# Patient Record
Sex: Female | Born: 1945 | Race: White | Hispanic: No
Health system: Western US, Academic
[De-identification: ages and names within clinical notes are randomized; demographics above are authoritative.]

## PROBLEM LIST (undated history)

## (undated) DIAGNOSIS — G4700 Insomnia, unspecified: Secondary | ICD-10-CM

## (undated) HISTORY — PX: BREAST EXCISIONAL BIOPSY: SUR124

## (undated) HISTORY — PX: BREAST BIOPSY: SHX20

---

## 2002-06-23 ENCOUNTER — Encounter: Payer: Self-pay | Admitting: Internal Medicine

## 2002-06-23 ENCOUNTER — Ambulatory Visit (HOSPITAL_COMMUNITY): Admission: RE | Admit: 2002-06-23 | Discharge: 2002-06-23 | Payer: Self-pay | Admitting: Internal Medicine

## 2003-07-23 ENCOUNTER — Ambulatory Visit (HOSPITAL_COMMUNITY): Admission: RE | Admit: 2003-07-23 | Discharge: 2003-07-23 | Payer: Self-pay | Admitting: Internal Medicine

## 2004-10-13 ENCOUNTER — Ambulatory Visit (HOSPITAL_COMMUNITY): Admission: RE | Admit: 2004-10-13 | Discharge: 2004-10-13 | Payer: Self-pay | Admitting: Internal Medicine

## 2005-09-13 ENCOUNTER — Other Ambulatory Visit: Admission: RE | Admit: 2005-09-13 | Discharge: 2005-09-13 | Payer: Self-pay | Admitting: Internal Medicine

## 2005-10-24 ENCOUNTER — Ambulatory Visit (HOSPITAL_COMMUNITY): Admission: RE | Admit: 2005-10-24 | Discharge: 2005-10-24 | Payer: Self-pay | Admitting: Internal Medicine

## 2005-11-01 ENCOUNTER — Encounter: Admission: RE | Admit: 2005-11-01 | Discharge: 2005-11-01 | Payer: Self-pay | Admitting: Internal Medicine

## 2006-11-18 ENCOUNTER — Ambulatory Visit (HOSPITAL_COMMUNITY): Admission: RE | Admit: 2006-11-18 | Discharge: 2006-11-18 | Payer: Self-pay | Admitting: Internal Medicine

## 2007-12-05 ENCOUNTER — Ambulatory Visit (HOSPITAL_COMMUNITY): Admission: RE | Admit: 2007-12-05 | Discharge: 2007-12-05 | Payer: Self-pay | Admitting: Internal Medicine

## 2007-12-10 ENCOUNTER — Encounter: Admission: RE | Admit: 2007-12-10 | Discharge: 2007-12-10 | Payer: Self-pay | Admitting: Internal Medicine

## 2008-12-22 ENCOUNTER — Ambulatory Visit (HOSPITAL_COMMUNITY): Admission: RE | Admit: 2008-12-22 | Discharge: 2008-12-22 | Payer: Self-pay | Admitting: Internal Medicine

## 2009-01-03 ENCOUNTER — Encounter: Admission: RE | Admit: 2009-01-03 | Discharge: 2009-01-03 | Payer: Self-pay | Admitting: Internal Medicine

## 2010-03-15 ENCOUNTER — Ambulatory Visit (HOSPITAL_COMMUNITY)
Admission: RE | Admit: 2010-03-15 | Discharge: 2010-03-15 | Payer: Self-pay | Source: Home / Self Care | Attending: Internal Medicine | Admitting: Internal Medicine

## 2010-04-02 ENCOUNTER — Encounter: Payer: Self-pay | Admitting: Internal Medicine

## 2010-04-03 ENCOUNTER — Encounter: Payer: Self-pay | Admitting: Internal Medicine

## 2011-04-11 ENCOUNTER — Other Ambulatory Visit (HOSPITAL_COMMUNITY): Payer: Self-pay | Admitting: Internal Medicine

## 2011-04-11 DIAGNOSIS — Z1231 Encounter for screening mammogram for malignant neoplasm of breast: Secondary | ICD-10-CM

## 2011-05-09 ENCOUNTER — Ambulatory Visit (HOSPITAL_COMMUNITY)
Admission: RE | Admit: 2011-05-09 | Discharge: 2011-05-09 | Disposition: A | Discharge status: Home / Self Care | Payer: BC Managed Care – PPO | Source: Ambulatory Visit | Attending: Internal Medicine | Admitting: Internal Medicine

## 2011-05-09 DIAGNOSIS — Z1231 Encounter for screening mammogram for malignant neoplasm of breast: Secondary | ICD-10-CM

## 2012-06-11 ENCOUNTER — Other Ambulatory Visit (HOSPITAL_COMMUNITY): Payer: Self-pay | Admitting: Internal Medicine

## 2012-06-11 DIAGNOSIS — Z1231 Encounter for screening mammogram for malignant neoplasm of breast: Secondary | ICD-10-CM

## 2012-06-18 ENCOUNTER — Ambulatory Visit (HOSPITAL_COMMUNITY)
Admission: RE | Admit: 2012-06-18 | Discharge: 2012-06-18 | Disposition: A | Discharge status: Home / Self Care | Payer: Medicare Other | Source: Ambulatory Visit | Attending: Internal Medicine | Admitting: Internal Medicine

## 2012-06-18 DIAGNOSIS — Z1231 Encounter for screening mammogram for malignant neoplasm of breast: Secondary | ICD-10-CM | POA: Insufficient documentation

## 2012-12-26 ENCOUNTER — Ambulatory Visit (INDEPENDENT_AMBULATORY_CARE_PROVIDER_SITE_OTHER): Payer: 59 | Admitting: Radiology

## 2012-12-26 DIAGNOSIS — Z23 Encounter for immunization: Secondary | ICD-10-CM

## 2013-04-16 ENCOUNTER — Other Ambulatory Visit: Payer: Self-pay | Admitting: Gastroenterology

## 2013-04-20 ENCOUNTER — Encounter (HOSPITAL_COMMUNITY): Payer: Self-pay | Admitting: Pharmacy Technician

## 2013-04-23 ENCOUNTER — Encounter (HOSPITAL_COMMUNITY): Payer: Self-pay | Admitting: *Deleted

## 2013-05-12 ENCOUNTER — Encounter (HOSPITAL_COMMUNITY)
Admission: RE | Disposition: A | Discharge status: Home / Self Care | Payer: Self-pay | Source: Ambulatory Visit | Attending: Gastroenterology

## 2013-05-12 ENCOUNTER — Ambulatory Visit (HOSPITAL_COMMUNITY)
Admission: RE | Admit: 2013-05-12 | Discharge: 2013-05-12 | Disposition: A | Discharge status: Home / Self Care | Payer: Medicare Other | Source: Ambulatory Visit | Attending: Gastroenterology | Admitting: Gastroenterology

## 2013-05-12 ENCOUNTER — Ambulatory Visit (HOSPITAL_COMMUNITY): Payer: Medicare Other | Admitting: *Deleted

## 2013-05-12 ENCOUNTER — Encounter (HOSPITAL_COMMUNITY): Payer: Self-pay | Admitting: *Deleted

## 2013-05-12 ENCOUNTER — Encounter (HOSPITAL_COMMUNITY): Payer: Medicare Other | Admitting: *Deleted

## 2013-05-12 DIAGNOSIS — Z87440 Personal history of urinary (tract) infections: Secondary | ICD-10-CM | POA: Insufficient documentation

## 2013-05-12 DIAGNOSIS — Z1211 Encounter for screening for malignant neoplasm of colon: Secondary | ICD-10-CM | POA: Insufficient documentation

## 2013-05-12 HISTORY — PX: COLONOSCOPY WITH PROPOFOL: SHX5780

## 2013-05-12 HISTORY — DX: Insomnia, unspecified: G47.00

## 2013-05-12 SURGERY — COLONOSCOPY WITH PROPOFOL
Anesthesia: Monitor Anesthesia Care

## 2013-05-12 MED ORDER — MIDAZOLAM HCL 5 MG/5ML IJ SOLN
INTRAMUSCULAR | Status: DC | PRN
  Administered 2013-05-12: 2 mg via INTRAVENOUS

## 2013-05-12 MED ORDER — PROPOFOL 10 MG/ML IV BOLUS
INTRAVENOUS | Status: DC | PRN
  Administered 2013-05-12: 50 mg via INTRAVENOUS

## 2013-05-12 MED ORDER — MIDAZOLAM HCL 2 MG/2ML IJ SOLN
INTRAMUSCULAR | Status: AC
  Filled 2013-05-12: qty 2

## 2013-05-12 MED ORDER — PROPOFOL 10 MG/ML IV BOLUS
INTRAVENOUS | Status: AC
  Filled 2013-05-12: qty 40

## 2013-05-12 MED ORDER — KETAMINE HCL 10 MG/ML IJ SOLN
INTRAMUSCULAR | Status: AC
  Filled 2013-05-12: qty 1

## 2013-05-12 MED ORDER — KETAMINE HCL 10 MG/ML IJ SOLN
INTRAMUSCULAR | Status: DC | PRN
  Administered 2013-05-12: 20 mg via INTRAVENOUS
  Administered 2013-05-12: 10 mg via INTRAVENOUS

## 2013-05-12 MED ORDER — GLYCOPYRROLATE 0.2 MG/ML IJ SOLN
INTRAMUSCULAR | Status: DC | PRN
  Administered 2013-05-12 (×2): .02 mg via INTRAVENOUS

## 2013-05-12 MED ORDER — SODIUM CHLORIDE 0.9 % IV SOLN: INTRAVENOUS | Status: DC

## 2013-05-12 MED ORDER — GLYCOPYRROLATE 0.2 MG/ML IJ SOLN
INTRAMUSCULAR | Status: AC
  Filled 2013-05-12: qty 1

## 2013-05-12 MED ORDER — LACTATED RINGERS IV SOLN
INTRAVENOUS | Status: DC
  Administered 2013-05-12: 08:00:00 via INTRAVENOUS

## 2013-05-12 MED ORDER — PROPOFOL INFUSION 10 MG/ML OPTIME
INTRAVENOUS | Status: DC | PRN
  Administered 2013-05-12: 200 ug/kg/min via INTRAVENOUS

## 2013-05-12 SURGICAL SUPPLY — 22 items

## 2013-05-12 NOTE — Anesthesia Preprocedure Evaluation (Signed)
Anesthesia Evaluation  Patient identified by MRN, date of birth, ID band Patient awake    Reviewed: Allergy & Precautions, H&P , NPO status , Patient's Chart, lab work & pertinent test results  Airway Mallampati: II TM Distance: >3 FB Neck ROM: Full    Dental no notable dental hx.    Pulmonary neg pulmonary ROS,  breath sounds clear to auscultation  Pulmonary exam normal       Cardiovascular negative cardio ROS  Rhythm:Regular Rate:Normal     Neuro/Psych negative neurological ROS  negative psych ROS   GI/Hepatic negative GI ROS, Neg liver ROS,   Endo/Other  negative endocrine ROS  Renal/GU negative Renal ROS  negative genitourinary   Musculoskeletal negative musculoskeletal ROS (+)   Abdominal   Peds negative pediatric ROS (+)  Hematology negative hematology ROS (+)   Anesthesia Other Findings   Reproductive/Obstetrics negative OB ROS                           Anesthesia Physical Anesthesia Plan  ASA: II  Anesthesia Plan: MAC   Post-op Pain Management:    Induction: Intravenous  Airway Management Planned: Simple Face Mask  Additional Equipment:   Intra-op Plan:   Post-operative Plan:   Informed Consent: I have reviewed the patients History and Physical, chart, labs and discussed the procedure including the risks, benefits and alternatives for the proposed anesthesia with the patient or authorized representative who has indicated his/her understanding and acceptance.   Dental advisory given  Plan Discussed with: CRNA  Anesthesia Plan Comments:         Anesthesia Quick Evaluation

## 2013-05-12 NOTE — Preoperative (Signed)
Beta Blockers   Reason not to administer Beta Blockers:Not Applicable, not on home BB 

## 2013-05-12 NOTE — Transfer of Care (Signed)
Immediate Anesthesia Transfer of Care Note  Patient: Ellen Gray  Procedure(s) Performed: Procedure(s): COLONOSCOPY WITH PROPOFOL (N/A)  Patient Location: PACU, endo  Anesthesia Type:MAC  Level of Consciousness: Patient easily awoken, sedated, comfortable, cooperative, following commands, responds to stimulation.   Airway & Oxygen Therapy: Patient spontaneously breathing, ventilating well, oxygen via simple oxygen mask.  Post-op Assessment: Report given to PACU RN, vital signs reviewed and stable, moving all extremities.   Post vital signs: Reviewed and stable.  Complications: No apparent anesthesia complications

## 2013-05-12 NOTE — Anesthesia Postprocedure Evaluation (Signed)
  Anesthesia Post-op Note  Patient: Ellen Gray  Procedure(s) Performed: Procedure(s) (LRB): COLONOSCOPY WITH PROPOFOL (N/A)  Patient Location: PACU  Anesthesia Type: MAC  Level of Consciousness: awake and alert   Airway and Oxygen Therapy: Patient Spontanous Breathing  Post-op Pain: mild  Post-op Assessment: Post-op Vital signs reviewed, Patient's Cardiovascular Status Stable, Respiratory Function Stable, Patent Airway and No signs of Nausea or vomiting  Last Vitals:  Filed Vitals:   05/12/13 0900  BP: 116/74  Pulse:   Temp:   Resp: 16    Post-op Vital Signs: stable   Complications: No apparent anesthesia complications

## 2013-05-12 NOTE — Op Note (Signed)
Procedure: Baseline screening colonoscopy  Endoscopist: Danise EdgeMartin Rodderick Holtzer  Premedication: Propofol administered by anesthesia  Procedure: The patient was placed in the left lateral decubitus position. Anal inspection and digital rectal exam were normal. The Pentax pediatric colonoscope was introduced into the rectum and advanced to the cecum. A normal-appearing appendiceal orifice and ileocecal valve were identified. Colonic preparation for the exam today was good.  Rectum. Normal. Retroflexed view of the distal rectum normal  Sigmoid colon and descending colon. Normal  Splenic flexure. Normal  Transverse colon. Normal  Hepatic flexure. Normal  Ascending colon. Normal  Cecum and ileocecal valve. Normal  Assessment: Normal screening proctocolonoscopy to the cecum  Recommendation: Schedule repeat screening colonoscopy in 10 years

## 2013-05-12 NOTE — H&P (Signed)
  Procedure: Baseline screening colonoscopy  History: The patient is a 68 year old female born 21-Dec-1945. She is scheduled to undergo her first screening colonoscopy with polypectomy to prevent colon cancer.  Past medical history: Urinary tract infection. Breast biopsy.  Habits: The patient is never smoked cigarettes  Exam: The patient is alert and lying comfortably on the endoscopy stretcher. Lungs are clear to auscultation. Cardiac exam reveals a regular rhythm. Abdomen is soft and nontender to palpation.  Plan: Proceed with baseline screening colonoscopy

## 2013-05-13 ENCOUNTER — Encounter (HOSPITAL_COMMUNITY): Payer: Self-pay | Admitting: Gastroenterology

## 2014-06-02 ENCOUNTER — Other Ambulatory Visit (HOSPITAL_COMMUNITY): Payer: Self-pay | Admitting: Internal Medicine

## 2014-06-02 DIAGNOSIS — Z1231 Encounter for screening mammogram for malignant neoplasm of breast: Secondary | ICD-10-CM

## 2014-06-22 ENCOUNTER — Other Ambulatory Visit (HOSPITAL_COMMUNITY): Payer: Self-pay | Admitting: Internal Medicine

## 2014-06-22 ENCOUNTER — Ambulatory Visit (HOSPITAL_COMMUNITY)
Admission: RE | Admit: 2014-06-22 | Discharge: 2014-06-22 | Disposition: A | Discharge status: Home / Self Care | Payer: Medicare Other | Source: Ambulatory Visit | Attending: Internal Medicine | Admitting: Internal Medicine

## 2014-06-22 DIAGNOSIS — Z1231 Encounter for screening mammogram for malignant neoplasm of breast: Secondary | ICD-10-CM | POA: Insufficient documentation

## 2015-06-03 ENCOUNTER — Other Ambulatory Visit: Payer: Self-pay

## 2015-06-03 DIAGNOSIS — Z1231 Encounter for screening mammogram for malignant neoplasm of breast: Secondary | ICD-10-CM

## 2015-06-29 ENCOUNTER — Ambulatory Visit
Admission: RE | Admit: 2015-06-29 | Discharge: 2015-06-29 | Disposition: A | Discharge status: Home / Self Care | Payer: Medicare Other | Source: Ambulatory Visit

## 2015-06-29 DIAGNOSIS — Z1231 Encounter for screening mammogram for malignant neoplasm of breast: Secondary | ICD-10-CM

## 2016-05-22 ENCOUNTER — Other Ambulatory Visit: Payer: Self-pay | Admitting: Internal Medicine

## 2016-05-22 DIAGNOSIS — Z1231 Encounter for screening mammogram for malignant neoplasm of breast: Secondary | ICD-10-CM

## 2016-06-29 ENCOUNTER — Ambulatory Visit
Admission: RE | Admit: 2016-06-29 | Discharge: 2016-06-29 | Disposition: A | Discharge status: Home / Self Care | Payer: Medicare Other | Source: Ambulatory Visit | Attending: Internal Medicine | Admitting: Internal Medicine

## 2016-06-29 DIAGNOSIS — Z1231 Encounter for screening mammogram for malignant neoplasm of breast: Secondary | ICD-10-CM

## 2016-07-06 ENCOUNTER — Other Ambulatory Visit (HOSPITAL_COMMUNITY)
Admission: RE | Admit: 2016-07-06 | Discharge: 2016-07-06 | Disposition: A | Discharge status: Home / Self Care | Payer: Medicare Other | Source: Ambulatory Visit | Attending: Internal Medicine | Admitting: Internal Medicine

## 2016-07-06 ENCOUNTER — Other Ambulatory Visit: Payer: Self-pay | Admitting: Internal Medicine

## 2016-07-06 DIAGNOSIS — Z1151 Encounter for screening for human papillomavirus (HPV): Secondary | ICD-10-CM | POA: Insufficient documentation

## 2016-07-06 DIAGNOSIS — Z01419 Encounter for gynecological examination (general) (routine) without abnormal findings: Secondary | ICD-10-CM | POA: Diagnosis present

## 2016-07-11 LAB — CYTOLOGY - PAP
Diagnosis: NEGATIVE
HPV: NOT DETECTED

## 2017-04-19 ENCOUNTER — Other Ambulatory Visit: Payer: Self-pay

## 2017-04-19 ENCOUNTER — Ambulatory Visit: Payer: Medicare Other | Admitting: Physician Assistant

## 2017-04-19 ENCOUNTER — Encounter: Payer: Self-pay | Admitting: Physician Assistant

## 2017-04-19 VITALS — BP 120/70 | HR 72 | Temp 98.0°F | Ht 64.0 in | Wt 118.2 lb

## 2017-04-19 DIAGNOSIS — R21 Rash and other nonspecific skin eruption: Secondary | ICD-10-CM | POA: Diagnosis not present

## 2017-04-19 MED ORDER — TRIAMCINOLONE ACETONIDE 0.1 % EX CREA: 1.0000 "application " | TOPICAL_CREAM | Freq: Two times a day (BID) | CUTANEOUS | 0 refills | Status: DC

## 2017-04-19 NOTE — Patient Instructions (Addendum)
I am treating you today for eczema (see below).  Apply cream to affected area 1-2 times daily.  To increase healing, wrap the area with plastic wrap after application. (maybe easier to do this at night) Call and leave me a message if you are not improving in 2-3 weeks. I will call in prednisone to your pharmacy.   Come back if your rash worsens or fails to improve.    Eczema Eczema is a broad term for a group of skin conditions that cause skin to become rough and inflamed. Each type of eczema has different triggers, symptoms, and treatments. Eczema of any type is usually itchy and symptoms range from mild to severe. Eczema and its symptoms are not spread from person to person (are not contagious). It can appear on different parts of the body at different times. Your eczema may not look the same as someone else's eczema. What are the types of eczema? Atopic dermatitis This is a long-term (chronic) skin disease that keeps coming back (recurring). Usual symptoms are dry skin and small, solid pimples that may swell and leak fluid (weep). Contact dermatitis This happens when something irritates the skin and causes a rash. The irritation can come from substances that you are allergic to (allergens), such as poison ivy, chemicals, or medicines that were applied to your skin. Dyshidrotic eczema This is a form of eczema on the hands and feet. It shows up as very itchy, fluid-filled blisters. It can affect people of any age, but is more common before age 71. Hand eczema This causes very itchy areas of skin on the palms and sides of the hands and fingers. This type of eczema is common in industrial jobs where you may be exposed to many different types of irritants. Lichen simplex chronicus This type of eczema occurs when a person constantly scratches one area of the body. Repeated scratching of the area leads to thickened skin (lichenification). Lichen simplex chronicus can occur along with other types of  eczema. It is more common in adults, but may be seen in children as well. Nummular eczema This is a common type of eczema. It has no known cause. It typically causes a red, circular, crusty lesion (plaque) that may be itchy. Scratching may become a habit and can cause bleeding. Nummular eczema occurs most often in people of middle-age or older. It most often affects the hands. Seborrheic dermatitis This is a common skin disease that mainly affects the scalp. It may also affect any oily areas of the body, such as the face, sides of nose, eyebrows, ears, eyelids, and chest. It is marked by small scaling and redness of the skin (erythema). This can affect people of all ages. In infants, this condition is known as Location manager"cradle cap." Stasis dermatitis This is a common skin disease that usually appears on the legs and feet. It most often occurs in people who have a condition that prevents blood from being pumped through the veins in the legs (chronic venous insufficiency). Stasis dermatitis is a chronic condition that needs long-term management. How is eczema diagnosed? Your health care provider will examine your skin and review your medical history. He or she may also give you skin patch tests. These tests involve taking patches that contain possible allergens and placing them on your back. He or she will then check in a few days to see if an allergic reaction occurred. What are the common treatments? Treatment for eczema is based on the type of eczema you have.  Hydrocortisone steroid medicine can relieve itching quickly and help reduce inflammation. This medicine may be prescribed or obtained over-the-counter, depending on the strength of the medicine that is needed. Follow these instructions at home:  Take over-the-counter and prescription medicines only as told by your health care provider.  Use creams or ointments to moisturize your skin. Do not use lotions.  Learn what triggers or irritates your  symptoms. Avoid these things.  Treat symptom flare-ups quickly.  Do not itch your skin. This can make your rash worse.  Keep all follow-up visits as told by your health care provider. This is important. Where to find more information:  The American Academy of Dermatology: InfoExam.si  The National Eczema Association: www.nationaleczema.org Contact a health care provider if:  You have serious itching, even with treatment.  You regularly scratch your skin until it bleeds.  Your rash looks different than usual.  Your skin is painful, swollen, or more red than usual.  You have a fever. Summary  There are eight general types of eczema. Each type has different triggers.  Eczema of any type causes itching that may range from mild to severe.  Treatment varies based on the type of eczema you have. Hydrocortisone steroid medicine can help with itching and inflammation.  Protecting your skin is the best way to prevent eczema. Use moisturizers and lotions. Avoid triggers and irritants, and treat flare-ups quickly. This information is not intended to replace advice given to you by your health care provider. Make sure you discuss any questions you have with your health care provider. Document Released: 07/12/2016 Document Revised: 07/12/2016 Document Reviewed: 07/12/2016 Elsevier Interactive Patient Education  2018 [employer].

## 2017-04-19 NOTE — Progress Notes (Signed)
   Ellen Gray  MRN: [id number] DOB: [birth year]  PCP: Deretha Emorysborne, James C, MD  Subjective:  Pt is a 72 year old female who presents to clinic for rash on legs x three weeks. Rash is on the front of both legs. Started as smaller area above ankles and has progressed over the past few weeks. Rash is on legs only. Denies itching, drainage, pain, vesicles. She has tried cortisone cream - not helping. ROS below.   Review of Systems  Constitutional: Negative for chills, diaphoresis, fatigue and fever.  Respiratory: Negative for cough.   Cardiovascular: Negative for chest pain and palpitations.  Gastrointestinal: Negative for abdominal pain, nausea and vomiting.  Musculoskeletal: Negative for arthralgias.  Skin: Positive for rash.    There are no active problems to display for this patient.   Current Outpatient Medications on File Prior to Visit  Medication Sig Dispense Refill  . Calcium Carbonate-Vit D-Min (CALCIUM 1200 PO) Take by mouth.    . diphenhydramine-acetaminophen (TYLENOL PM) 25-500 MG TABS Take 1 tablet by mouth at bedtime.    . Multiple Vitamin (MULTIVITAMIN WITH MINERALS) TABS tablet Take 1 tablet by mouth daily.    . Vitamin D, Ergocalciferol, (DRISDOL) 50000 UNITS CAPS capsule Take 50,000 Units by mouth every 7 (seven) days.     No current facility-administered medications on file prior to visit.     No Known Allergies   Objective:  BP 120/70   Pulse 72   Temp 98 F (36.7 C) (Oral)   Ht 5\' 4"  (1.626 m)   Wt 118 lb 3.2 oz (53.6 kg)   SpO2 98%   BMI 20.29 kg/m   Physical Exam  Constitutional: She is oriented to person, place, and time and well-developed, well-nourished, and in no distress. No distress.  Cardiovascular: Normal rate, regular rhythm and normal heart sounds.  Neurological: She is alert and oriented to person, place, and time. GCS score is 15.  Skin: Skin is warm and dry. Rash noted.  Psychiatric: Mood, memory, affect and judgment normal.  Vitals  reviewed.       Assessment and Plan :  1. Rash and nonspecific skin eruption - triamcinolone cream (KENALOG) 0.1 %; Apply 1 application topically 2 (two) times daily.  Dispense: 30 g; Refill: 0 - Pt c/o rash b/l lower anterior legs. Suspect possibly nummular eczema. Plan to treat with topical kenalog.  OK to call if no improvement in 2-3 weeks. I will call in prednisone to pharmacy.  Marco CollieWhitney Lillith Mcneff, PA-C  [facility] 04/19/2017 5:20 PM

## 2017-05-20 ENCOUNTER — Other Ambulatory Visit: Payer: Self-pay | Admitting: Internal Medicine

## 2017-05-20 DIAGNOSIS — Z1231 Encounter for screening mammogram for malignant neoplasm of breast: Secondary | ICD-10-CM

## 2017-07-25 ENCOUNTER — Ambulatory Visit: Payer: Medicare Other

## 2017-07-25 ENCOUNTER — Ambulatory Visit
Admission: RE | Admit: 2017-07-25 | Discharge: 2017-07-25 | Disposition: A | Discharge status: Home / Self Care | Payer: Medicare Other | Source: Ambulatory Visit | Attending: Internal Medicine | Admitting: Internal Medicine

## 2017-07-25 DIAGNOSIS — Z1231 Encounter for screening mammogram for malignant neoplasm of breast: Secondary | ICD-10-CM

## 2018-01-15 ENCOUNTER — Ambulatory Visit: Payer: Self-pay | Admitting: Physician Assistant

## 2018-01-16 ENCOUNTER — Encounter: Payer: Self-pay | Admitting: Emergency Medicine

## 2018-01-16 ENCOUNTER — Ambulatory Visit: Payer: Medicare Other | Admitting: Emergency Medicine

## 2018-01-16 ENCOUNTER — Other Ambulatory Visit: Payer: Self-pay

## 2018-01-16 VITALS — BP 128/67 | HR 68 | Temp 98.8°F | Resp 16 | Ht 64.0 in | Wt 112.8 lb

## 2018-01-16 DIAGNOSIS — L299 Pruritus, unspecified: Secondary | ICD-10-CM

## 2018-01-16 DIAGNOSIS — R21 Rash and other nonspecific skin eruption: Secondary | ICD-10-CM | POA: Diagnosis not present

## 2018-01-16 DIAGNOSIS — T7840XA Allergy, unspecified, initial encounter: Secondary | ICD-10-CM | POA: Diagnosis not present

## 2018-01-16 MED ORDER — PREDNISONE 20 MG PO TABS: 40.0000 mg | ORAL_TABLET | Freq: Every day | ORAL | 0 refills | Status: AC

## 2018-01-16 MED ORDER — CETIRIZINE HCL 10 MG PO TABS: 10.0000 mg | ORAL_TABLET | Freq: Every day | ORAL | 11 refills | Status: AC

## 2018-01-16 NOTE — Patient Instructions (Addendum)
     If you have lab work done today you will be contacted with your lab results within the next 2 weeks.  If you have not heard from us then please contact us. The fastest way to get your results is to register for My Chart.   IF you received an x-ray today, you will receive an invoice from [facility]. Please contact  Radiology at [phone] with questions or concerns regarding your invoice.   IF you received labwork today, you will receive an invoice from [facility]. Please contact [facility] at [phone] with questions or concerns regarding your invoice.   Our billing staff will not be able to assist you with questions regarding bills from these companies.  You will be contacted with the lab results as soon as they are available. The fastest way to get your results is to activate your My Chart account. Instructions are located on the last page of this paperwork. If you have not heard from us regarding the results in 2 weeks, please contact this office.     Allergies An allergy is when your body reacts to a substance in a way that is not normal. An allergic reaction can happen after you:  Eat something.  Breathe in something.  Touch something.  You can be allergic to:  Things that are only around during certain seasons, like molds and pollens.  Foods.  Drugs.  Insects.  Animal dander.  What are the signs or symptoms?  Puffiness (swelling). This may happen on the lips, face, tongue, mouth, or throat.  Sneezing.  Coughing.  Breathing loudly (wheezing).  Stuffy nose.  Tingling in the mouth.  A rash.  Itching.  Itchy, red, puffy areas of skin (hives).  Watery eyes.  Throwing up (vomiting).  Watery poop (diarrhea).  Dizziness.  Feeling faint or fainting.  Trouble breathing or swallowing.  A tight feeling in the chest.  A fast heartbeat. How is this diagnosed? Allergies can be diagnosed with:  A medical and family  history.  Skin tests.  Blood tests.  A food diary. A food diary is a record of all the foods, drinks, and symptoms you have each day.  The results of an elimination diet. This diet involves making sure not to eat certain foods and then seeing what happens when you start eating them again.  How is this treated? There is no cure for allergies, but allergic reactions can be treated with medicine. Severe reactions usually need to be treated at a hospital. How is this prevented? The best way to prevent an allergic reaction is to avoid the thing you are allergic to. Allergy shots and medicines can also help prevent reactions in some cases. This information is not intended to replace advice given to you by your health care provider. Make sure you discuss any questions you have with your health care provider. Document Released: 06/23/2012 Document Revised: 10/24/2015 Document Reviewed: 12/08/2013 Elsevier Interactive Patient Education  2018 Elsevier Inc.  

## 2018-01-16 NOTE — Progress Notes (Signed)
Ellen Gray 72 y.o.   Chief Complaint  Patient presents with  . Rash    on body x 3-4 days with itching    HISTORY OF PRESENT ILLNESS: This is a 72 y.o. female complaining of itchy diffuse rash for the last 3 to 4 days.  Unknown allergen.  No other significant symptoms.  Has a history of similar events in the past.  No chronic medical condition.  No history of autoimmune disease.  HPI   Prior to Admission medications   Medication Sig Start Date End Date Taking? Authorizing Provider  Calcium Carbonate-Vit D-Min (CALCIUM 1200 PO) Take by mouth.   Yes [provider]  diphenhydramine-acetaminophen (TYLENOL PM) 25-500 MG TABS Take 1 tablet by mouth at bedtime.   Yes [provider]  Multiple Vitamin (MULTIVITAMIN WITH MINERALS) TABS tablet Take 1 tablet by mouth daily.   Yes [provider]  triamcinolone cream (KENALOG) 0.1 % Apply 1 application topically 2 (two) times daily. Patient not taking: Reported on 01/16/2018 04/19/17   McVey, Madelaine Bhat, PA-C  Vitamin D, Ergocalciferol, (DRISDOL) 50000 UNITS CAPS capsule Take 50,000 Units by mouth every 7 (seven) days.    [provider]    No Known Allergies  There are no active problems to display for this patient.   Past Medical History:  Diagnosis Date  . Insomnia     Past Surgical History:  Procedure Laterality Date  . BREAST BIOPSY  30 yrs ago  . BREAST EXCISIONAL BIOPSY Left    1988  . COLONOSCOPY WITH PROPOFOL N/A 05/12/2013   Procedure: COLONOSCOPY WITH PROPOFOL;  Surgeon: Charolett Bumpers, MD;  Location: [facility];  Service: Endoscopy;  Laterality: N/A;    Social History   Socioeconomic History  . Marital status: Divorced    Spouse name: Not on file  . Number of children: Not on file  . Years of education: Not on file  . Highest education level: Not on file  Occupational History  . Not on file  Social Needs  . Financial resource strain: Not on file  . Food  insecurity:    Worry: Not on file    Inability: Not on file  . Transportation needs:    Medical: Not on file    Non-medical: Not on file  Tobacco Use  . Smoking status: Never Smoker  . Smokeless tobacco: Never Used  Substance and Sexual Activity  . Alcohol use: Yes    Alcohol/week: 1.0 standard drinks    Types: 1 Standard drinks or equivalent per week    Comment: occasional  . Drug use: No  . Sexual activity: Not on file  Lifestyle  . Physical activity:    Days per week: Not on file    Minutes per session: Not on file  . Stress: Not on file  Relationships  . Social connections:    Talks on phone: Not on file    Gets together: Not on file    Attends religious service: Not on file    Active member of club or organization: Not on file    Attends meetings of clubs or organizations: Not on file    Relationship status: Not on file  . Intimate partner violence:    Fear of current or ex partner: Not on file    Emotionally abused: Not on file    Physically abused: Not on file    Forced sexual activity: Not on file  Other Topics Concern  . Not on file  Social History Narrative  . Not on file    Family History  Problem Relation Age of Onset  . Breast cancer Neg Hx      Review of Systems  Constitutional: Negative.  Negative for chills and fever.  HENT: Negative.  Negative for congestion and sore throat.   Eyes: Negative.  Negative for discharge and redness.  Respiratory: Negative.  Negative for cough, shortness of breath and wheezing.   Cardiovascular: Negative.  Negative for chest pain and palpitations.  Gastrointestinal: Negative.  Negative for abdominal pain, diarrhea, nausea and vomiting.  Genitourinary: Negative.  Negative for dysuria and hematuria.  Musculoskeletal: Negative for back pain, joint pain, myalgias and neck pain.  Skin: Positive for itching and rash.  Neurological: Negative.  Negative for dizziness, sensory change, focal weakness and headaches.    Endo/Heme/Allergies: Negative.    Vitals:   01/16/18 0805  BP: 128/67  Pulse: 68  Resp: 16  Temp: 98.8 F (37.1 C)  SpO2: 97%     Physical Exam  Constitutional: She is oriented to person, place, and time. She appears well-developed and well-nourished.  HENT:  Head: Normocephalic and atraumatic.  Right Ear: External ear normal.  Left Ear: External ear normal.  Nose: Nose normal.  Mouth/Throat: Oropharynx is clear and moist.  Eyes: Pupils are equal, round, and reactive to light. Conjunctivae and EOM are normal.  Neck: Normal range of motion. Neck supple.  Cardiovascular: Normal rate, regular rhythm and normal heart sounds.  Pulmonary/Chest: Effort normal and breath sounds normal.  Abdominal: Soft. There is no tenderness.  Musculoskeletal: Normal range of motion.  Lymphadenopathy:    She has no cervical adenopathy.  Neurological: She is alert and oriented to person, place, and time. No sensory deficit. She exhibits normal muscle tone. Coordination normal.  Skin: Capillary refill takes less than 2 seconds. Rash (Faint maculopapular diffuse rash) noted.  Psychiatric: She has a normal mood and affect. Her behavior is normal.  Vitals reviewed.    ASSESSMENT & PLAN: Ellen Gray was seen today for rash.  Diagnoses and all orders for this visit:  Allergic reaction, initial encounter -     predniSONE (DELTASONE) 20 MG tablet; Take 2 tablets (40 mg total) by mouth daily with breakfast for 5 days. -     cetirizine (ZYRTEC) 10 MG tablet; Take 1 tablet (10 mg total) by mouth daily for 7 days.  Pruritus  Rash and nonspecific skin eruption    Patient Instructions       If you have lab work done today you will be contacted with your lab results within the next 2 weeks.  If you have not heard from [place] then please contact us. The fastest way to get your results is to register for My Chart.   IF you received an x-ray today, you will receive an invoice from [facility].  Please contact [facility] at [phone] with questions or concerns regarding your invoice.   IF you received labwork today, you will receive an invoice from [facility]. Please contact [facility] at [phone] with questions or concerns regarding your invoice.   Our billing staff will not be able to assist you with questions regarding bills from these companies.  You will be contacted with the lab results as soon as they are available. The fastest way to get your results is to activate your My Chart account. Instructions are located on the last page of this paperwork. If you have not heard from [place] regarding the results in 2 weeks, please  contact this office.     Allergies An allergy is when your body reacts to a substance in a way that is not normal. An allergic reaction can happen after you:  Eat something.  Breathe in something.  Touch something.  You can be allergic to:  Things that are only around during certain seasons, like molds and pollens.  Foods.  Drugs.  Insects.  Animal dander.  What are the signs or symptoms?  Puffiness (swelling). This may happen on the lips, face, tongue, mouth, or throat.  Sneezing.  Coughing.  Breathing loudly (wheezing).  Stuffy nose.  Tingling in the mouth.  A rash.  Itching.  Itchy, red, puffy areas of skin (hives).  Watery eyes.  Throwing up (vomiting).  Watery poop (diarrhea).  Dizziness.  Feeling faint or fainting.  Trouble breathing or swallowing.  A tight feeling in the chest.  A fast heartbeat. How is this diagnosed? Allergies can be diagnosed with:  A medical and family history.  Skin tests.  Blood tests.  A food diary. A food diary is a record of all the foods, drinks, and symptoms you have each day.  The results of an elimination diet. This diet involves making sure not to eat certain foods and then seeing what happens when you start eating them again.  How is this treated? There is  no cure for allergies, but allergic reactions can be treated with medicine. Severe reactions usually need to be treated at a hospital. How is this prevented? The best way to prevent an allergic reaction is to avoid the thing you are allergic to. Allergy shots and medicines can also help prevent reactions in some cases. This information is not intended to replace advice given to you by your health care provider. Make sure you discuss any questions you have with your health care provider. Document Released: 06/23/2012 Document Revised: 10/24/2015 Document Reviewed: 12/08/2013 Elsevier Interactive Patient Education  2018 [employer].      Edwina Barth, MD [facility]

## 2018-07-30 ENCOUNTER — Other Ambulatory Visit: Payer: Self-pay | Admitting: Internal Medicine

## 2018-07-30 DIAGNOSIS — Z1231 Encounter for screening mammogram for malignant neoplasm of breast: Secondary | ICD-10-CM

## 2018-09-17 ENCOUNTER — Ambulatory Visit
Admission: RE | Admit: 2018-09-17 | Discharge: 2018-09-17 | Disposition: A | Discharge status: Home / Self Care | Payer: Medicare Other | Source: Ambulatory Visit | Attending: Internal Medicine | Admitting: Internal Medicine

## 2018-09-17 ENCOUNTER — Other Ambulatory Visit: Payer: Self-pay

## 2018-09-17 DIAGNOSIS — Z1231 Encounter for screening mammogram for malignant neoplasm of breast: Secondary | ICD-10-CM

## 2019-05-09 ENCOUNTER — Ambulatory Visit: Payer: Medicare PPO | Attending: Internal Medicine

## 2019-05-09 DIAGNOSIS — Z23 Encounter for immunization: Secondary | ICD-10-CM

## 2019-05-09 NOTE — Progress Notes (Signed)
   Covid-19 Vaccination Clinic  Name:  Ellen Gray    MRN: [id number] DOB: 19-Feb-1946  05/09/2019  Ellen Gray was observed post Covid-19 immunization for 15 minutes without incidence. She was provided with Vaccine Information Sheet and instruction to access the V-Safe system.   Ellen Gray was instructed to call 911 with any severe reactions post vaccine: Marland Kitchen Difficulty breathing  . Swelling of your face and throat  . A fast heartbeat  . A bad rash all over your body  . Dizziness and weakness    Immunizations Administered    Name Date Dose VIS Date Route   Pfizer COVID-19 Vaccine 05/09/2019 10:49 AM 0.3 mL 02/20/2019 Intramuscular   Manufacturer: [employer], [employer]   Lot: [id number]   NDC: [address]-0

## 2019-06-03 ENCOUNTER — Ambulatory Visit: Payer: Medicare PPO | Attending: Internal Medicine

## 2019-06-03 DIAGNOSIS — Z23 Encounter for immunization: Secondary | ICD-10-CM

## 2019-06-03 NOTE — Progress Notes (Signed)
   Covid-19 Vaccination Clinic  Name:  Ellen Gray    MRN: [id number] DOB: 1945/12/19  06/03/2019  [name] was observed post Covid-19 immunization for 15 minutes without incident. She was provided with Vaccine Information Sheet and instruction to access the V-Safe system.   [name] was instructed to call 911 with any severe reactions post vaccine: Marland Kitchen Difficulty breathing  . Swelling of face and throat  . A fast heartbeat  . A bad rash all over body  . Dizziness and weakness   Immunizations Administered    Name Date Dose VIS Date Route   Pfizer COVID-19 Vaccine 06/03/2019  3:26 PM 0.3 mL 02/20/2019 Intramuscular   Manufacturer: [employer], [employer]   Lot: [id number]   NDC: [address]-2

## 2019-08-03 ENCOUNTER — Other Ambulatory Visit: Payer: Self-pay | Admitting: Internal Medicine

## 2019-08-03 DIAGNOSIS — M810 Age-related osteoporosis without current pathological fracture: Secondary | ICD-10-CM

## 2019-08-03 DIAGNOSIS — Z1231 Encounter for screening mammogram for malignant neoplasm of breast: Secondary | ICD-10-CM

## 2019-09-30 ENCOUNTER — Ambulatory Visit: Payer: Medicare PPO

## 2019-09-30 ENCOUNTER — Other Ambulatory Visit: Payer: Medicare PPO

## 2019-10-08 ENCOUNTER — Ambulatory Visit
Admission: RE | Admit: 2019-10-08 | Discharge: 2019-10-08 | Disposition: A | Discharge status: Home / Self Care | Payer: Medicare PPO | Source: Ambulatory Visit | Attending: Internal Medicine | Admitting: Internal Medicine

## 2019-10-08 ENCOUNTER — Other Ambulatory Visit: Payer: Self-pay

## 2019-10-08 DIAGNOSIS — M810 Age-related osteoporosis without current pathological fracture: Secondary | ICD-10-CM

## 2019-10-08 DIAGNOSIS — Z1231 Encounter for screening mammogram for malignant neoplasm of breast: Secondary | ICD-10-CM

## 2019-10-13 DIAGNOSIS — D1801 Hemangioma of skin and subcutaneous tissue: Secondary | ICD-10-CM | POA: Diagnosis not present

## 2019-10-13 DIAGNOSIS — L814 Other melanin hyperpigmentation: Secondary | ICD-10-CM | POA: Diagnosis not present

## 2019-10-13 DIAGNOSIS — L570 Actinic keratosis: Secondary | ICD-10-CM | POA: Diagnosis not present

## 2019-10-13 DIAGNOSIS — L821 Other seborrheic keratosis: Secondary | ICD-10-CM | POA: Diagnosis not present

## 2019-10-13 DIAGNOSIS — D225 Melanocytic nevi of trunk: Secondary | ICD-10-CM | POA: Diagnosis not present

## 2019-11-06 ENCOUNTER — Ambulatory Visit
Admission: RE | Admit: 2019-11-06 | Discharge: 2019-11-06 | Disposition: A | Discharge status: Home / Self Care | Payer: Medicare PPO | Source: Ambulatory Visit | Attending: Internal Medicine | Admitting: Internal Medicine

## 2019-11-06 ENCOUNTER — Other Ambulatory Visit: Payer: Self-pay | Admitting: Internal Medicine

## 2019-11-06 DIAGNOSIS — M1711 Unilateral primary osteoarthritis, right knee: Secondary | ICD-10-CM

## 2019-11-06 DIAGNOSIS — M1990 Unspecified osteoarthritis, unspecified site: Secondary | ICD-10-CM | POA: Diagnosis not present

## 2019-11-06 DIAGNOSIS — M810 Age-related osteoporosis without current pathological fracture: Secondary | ICD-10-CM | POA: Diagnosis not present

## 2020-02-08 ENCOUNTER — Ambulatory Visit
Admission: RE | Admit: 2020-02-08 | Discharge: 2020-02-08 | Disposition: A | Discharge status: Home / Self Care | Payer: Medicare PPO | Source: Ambulatory Visit | Attending: Internal Medicine | Admitting: Internal Medicine

## 2020-02-08 ENCOUNTER — Other Ambulatory Visit: Payer: Self-pay | Admitting: Internal Medicine

## 2020-02-08 DIAGNOSIS — M79674 Pain in right toe(s): Secondary | ICD-10-CM

## 2020-02-08 DIAGNOSIS — M19071 Primary osteoarthritis, right ankle and foot: Secondary | ICD-10-CM | POA: Diagnosis not present

## 2020-05-10 DIAGNOSIS — D485 Neoplasm of uncertain behavior of skin: Secondary | ICD-10-CM | POA: Diagnosis not present

## 2020-05-10 DIAGNOSIS — C4442 Squamous cell carcinoma of skin of scalp and neck: Secondary | ICD-10-CM | POA: Diagnosis not present

## 2020-05-10 DIAGNOSIS — L821 Other seborrheic keratosis: Secondary | ICD-10-CM | POA: Diagnosis not present

## 2020-05-10 DIAGNOSIS — L820 Inflamed seborrheic keratosis: Secondary | ICD-10-CM | POA: Diagnosis not present

## 2020-05-19 DIAGNOSIS — C4442 Squamous cell carcinoma of skin of scalp and neck: Secondary | ICD-10-CM | POA: Diagnosis not present

## 2020-07-29 DIAGNOSIS — J0100 Acute maxillary sinusitis, unspecified: Secondary | ICD-10-CM | POA: Diagnosis not present

## 2020-08-09 DIAGNOSIS — Z0000 Encounter for general adult medical examination without abnormal findings: Secondary | ICD-10-CM | POA: Diagnosis not present

## 2020-08-09 DIAGNOSIS — E559 Vitamin D deficiency, unspecified: Secondary | ICD-10-CM | POA: Diagnosis not present

## 2020-08-09 DIAGNOSIS — M810 Age-related osteoporosis without current pathological fracture: Secondary | ICD-10-CM | POA: Diagnosis not present

## 2020-08-09 DIAGNOSIS — M1711 Unilateral primary osteoarthritis, right knee: Secondary | ICD-10-CM | POA: Diagnosis not present

## 2020-08-09 DIAGNOSIS — Z1389 Encounter for screening for other disorder: Secondary | ICD-10-CM | POA: Diagnosis not present

## 2020-08-09 DIAGNOSIS — Z79899 Other long term (current) drug therapy: Secondary | ICD-10-CM | POA: Diagnosis not present

## 2020-08-09 DIAGNOSIS — E785 Hyperlipidemia, unspecified: Secondary | ICD-10-CM | POA: Diagnosis not present

## 2020-08-19 DIAGNOSIS — J309 Allergic rhinitis, unspecified: Secondary | ICD-10-CM | POA: Diagnosis not present

## 2020-09-06 ENCOUNTER — Other Ambulatory Visit: Payer: Self-pay | Admitting: Internal Medicine

## 2020-09-06 DIAGNOSIS — Z1231 Encounter for screening mammogram for malignant neoplasm of breast: Secondary | ICD-10-CM

## 2020-09-20 DIAGNOSIS — L821 Other seborrheic keratosis: Secondary | ICD-10-CM | POA: Diagnosis not present

## 2020-09-20 DIAGNOSIS — Z85828 Personal history of other malignant neoplasm of skin: Secondary | ICD-10-CM | POA: Diagnosis not present

## 2020-09-20 DIAGNOSIS — D225 Melanocytic nevi of trunk: Secondary | ICD-10-CM | POA: Diagnosis not present

## 2020-09-20 DIAGNOSIS — L905 Scar conditions and fibrosis of skin: Secondary | ICD-10-CM | POA: Diagnosis not present

## 2020-09-20 DIAGNOSIS — L814 Other melanin hyperpigmentation: Secondary | ICD-10-CM | POA: Diagnosis not present

## 2020-11-01 ENCOUNTER — Other Ambulatory Visit: Payer: Self-pay

## 2020-11-01 ENCOUNTER — Ambulatory Visit
Admission: RE | Admit: 2020-11-01 | Discharge: 2020-11-01 | Disposition: A | Discharge status: Home / Self Care | Payer: Medicare PPO | Source: Ambulatory Visit | Attending: Internal Medicine | Admitting: Internal Medicine

## 2020-11-01 DIAGNOSIS — Z1231 Encounter for screening mammogram for malignant neoplasm of breast: Secondary | ICD-10-CM | POA: Diagnosis not present

## 2020-12-31 DIAGNOSIS — Z23 Encounter for immunization: Secondary | ICD-10-CM | POA: Diagnosis not present

## 2021-03-22 DIAGNOSIS — R059 Cough, unspecified: Secondary | ICD-10-CM | POA: Diagnosis not present

## 2021-03-22 DIAGNOSIS — J069 Acute upper respiratory infection, unspecified: Secondary | ICD-10-CM | POA: Diagnosis not present

## 2021-03-22 DIAGNOSIS — R519 Headache, unspecified: Secondary | ICD-10-CM | POA: Diagnosis not present

## 2021-07-15 ENCOUNTER — Ambulatory Visit (INDEPENDENT_AMBULATORY_CARE_PROVIDER_SITE_OTHER): Payer: Medicare HMO | Admitting: Family Medicine

## 2021-07-15 VITALS — BP 149/74 | HR 106 | Temp 98.8°F | Resp 18

## 2021-07-15 DIAGNOSIS — J45909 Unspecified asthma, uncomplicated: Secondary | ICD-10-CM

## 2021-07-15 DIAGNOSIS — U071 COVID-19: Secondary | ICD-10-CM

## 2021-07-15 LAB — POC COVID-19 QUAL RAPID PCR, ONSITE (UWNC): COVID-19 Qualitative Rapid PCR Result (UWNC): POSITIVE — AB

## 2021-07-15 MED ORDER — GUAIFENESIN-CODEINE 100-10 MG/5ML OR SYRP
5.0000 mL | ORAL_SOLUTION | ORAL | 0 refills | Status: AC | PRN
Start: 2021-07-15 — End: ?

## 2021-07-15 MED ORDER — METHYLPREDNISOLONE 4 MG OR TABS
ORAL_TABLET | ORAL | 0 refills | Status: AC
Start: 2021-07-15 — End: ?

## 2021-07-15 NOTE — Patient Instructions (Signed)
Your test for COVID-19 was positive.    I have prescribed some prednisone to help with the wheezing.    Codeine cough syrup prescribed    You should isolate until you are feeling better.    Please make sure your husband tells his doctor about your COVID-19 as he may benefit from [place].    Your test for the novel coronavirus, also known as COVID-19, was positive. The sample showed that the virus was present.     Isolation Recommendations:  If your symptoms are generally mild and stable, you are safe to isolate yourself at home. If you develop difficulty breathing, you should contact your doctor.     The most important thing you can do is to remain home except to receive medical care. Do not go to work, school, or public areas.     You should remain isolated:  for a minimum of 5 days from symptom onset and   at least 24 hours have passed since your last fever without the use of fever-reducing medications and   other COVID-19 symptoms have improved.  After which you should wear a high-quality, well-fitting mask around others at all times for 5 additional days.    If you are unable to isolate, please contact your clinic for guidance.   If you have not had any symptoms of COVID-19 (asymptomatic), you should remain isolated for 5 days from the date of your positive test and then should wear a high-quality, well-fitting mask around others at all times for another 5 days. If you go on to develop symptoms, you should isolate according to the guidelines above.    If you are a Dietitian, please communicate with your employer and their employee health program for return-to-work protocols.    Guidance may differ for people with moderate or severe illness, people with compromised immune systems, and people who live or work in congregate settings. For these situations, please refer to information at the [facility] website at [email or web address].org/.html     Treatment:  There  are oral medications available to treat patients with mild-to-moderate COVID-19 and who are at higher risk for progressing to severe COVID-19 according to the CDC.    Your symptoms must have started within the last 5 days and be considered high-risk for developing severe COVID-19.      See [facility] website for additional information. MediaLives.de    If you feel you are eligible, talk to your provider.     Please do not go to the emergency department or pharmacy to request treatment without prior approval. However, if you have life-threatening symptoms, please seek medical care immediately.    Re-Testing:  If you have tested positive for COVID-19, the CDC does not recommend repeat testing within 90 days of the positive test, unless stated otherwise by your healthcare provider. The test can remain positive for many weeks due to fragments of virus that may be detected but are no longer infectious.    For recommendations on testing and quarantine for household contacts, please visit [email or web address]     Household contacts:  Everyone who has been exposed regardless of vaccination status should get a COVID test five days after exposure. If symptoms occur, individuals should immediately quarantine until a test confirms symptoms are not caused due to COVID-19.    To minimize exposure and spread, layer your protection strategies by:  Getting vaccinated or boosted when eligible  Avoiding large gatherings and crowded indoor spaces  Wearing a high-quality and well-fitting mask indoors  in public places  Getting tested prior to gathering with others. Testing the day of the gathering is best.  Optimizing indoor air quality and ventilation.    Please remember to:  Wash your hands often with soap and water for at least 20 seconds. Alternatively, you may use hand sanitizer with at least 60% alcohol content.  You should wear a  mask over your nose and mouth if you must be around other people or animals, including pets (even at home). You don't need to wear the mask if you are alone.   Try to stay at least 6 feet away from other people. This will help protect the people around you.  Clean all "high-touch" surfaces every day such as doorknobs and cellphones using an EPA approved cleaner (more information can be found on the CDC's website).  Continually monitor your symptoms and contact your doctor if you need medical attention.    For COVID testing sites: SocialDemand.no  Please remember to:  Get vaccinated or boosted when eligible: ForumChats.es  Avoid large gatherings and crowded indoor spaces  Wear a high-quality and well-fitting mask in public spaces  Get tested prior to gathering with others. Testing on the day of the gathering is best.  Open windows and doors when gathering in indoor spaces.     Visit the [facility] COVID-19 Page for additional information at [email or web address].[email or web address]/    For general Covid-19 questions please call us at [phone]    Buren Kos, M.D.  Clinical Assistant Professor  Department of [place] [facility]

## 2021-07-15 NOTE — Progress Notes (Signed)
The following encounter was generated from the [facility] [place].    New patient.      ID/CC: 76 yo woman with cough     HPI: she has been ill a little over a week.  Started with scratchy throat and drippy nose and is now in her chest.  Still with somie nasal congestion.  Throat is still irritated mainly because she is breathing through her mouth.  Hard to breathe through her nose.    Thick nasal discharge.    Cough is present day and night.  Occasionally SOB.  Brings thick phlegm up.      Has been hoarse    No fevers or chills.      Had negative home COVID-19 test a couple days ago.    Had COVID-19 in February, 2023.  Got over it in about 5 days, but husband needed long stretch in the ICU and rehab.    Has asthma.  Has been using her asthma medications.    Has tried coricidin, Nasacort, Afrin    No past medical history on file.    There is no problem list on file for this patient.          Outpatient Medications Prior to Visit   Medication Sig Dispense Refill   . Advair Diskus 500-50 MCG/ACT diskus inhaler inhale 1 puff by mouth 2 times daily. rinse mouth after use     . albuterol HFA 108 (90 Base) MCG/ACT inhaler Inhale 2 puffs by mouth.     Marland Kitchen atorvastatin 20 MG tablet Take 1 tablet (20 mg) by mouth.     . biotin 1000 MCG tablet Take by mouth.     . cholecalciferol 50 mcg (2,000 unit) capsule Take by mouth.     . losartan 100 MG tablet Take 1 tablet (100 mg) by mouth daily.     . montelukast 10 MG tablet Take 1 tablet (10 mg) by mouth.       No facility-administered medications prior to visit.     Review of patient's allergies indicates:  Allergies   Allergen Reactions   . Lisinopril Resp: Cough   . Sulfa Antibiotics Skin: Hives         Exam:  BP (!) 149/74   Pulse (!) 106   Temp 37.1 C (Temporal)   Resp 18   SpO2 97%   General appearance: she has a moist cough  Head: normocephalic, atraumatic.    Eyes:  Normal lids.  Conjunctiva without redness or discharge, extraocular muscles intact, pupils  equally round and reactive to light.  Ears: pinnae normal bilaterally, external auditory canals normal, no obstruction, tympanic membranes normal bilaterally without any signs of redness or effusion beyond the TM.  Nose: normal nares, no nasal discharge, normal mucosa.  Mouth: normal lips, gums, pharynx normal without signs of inflammation.  Very hoarse.  Neck: supple, good range of motion, lymph nodes not enlarged or tender, no thyromegaly.    Lungs: central rhonchi and wheezing.  Lots of coughing.  Heart:  RRR, no murmurs    Labs/studies:    Results for orders placed or performed in visit on 07/15/21   POC COVID-19 Qual Rapid PCR, Onsite [facility])   Result Value Ref Range    COVID-19 Qualitative Rapid PCR Result ([facility]) POSITIVE (A)          Assessment:    Encounter Diagnoses   Name Primary?   . 2019 novel coronavirus disease (COVID-19) Yes   . Asthma, unspecified  asthma severity, unspecified whether complicated, unspecified whether persistent      She is outside the window for Paxlovid.      Plan:    1.  Discussed findings and impressions with patient.  2.  I explained that this is viral and that antibiotics won't help at this stage.  3.  Medrol dosepak prescribed to help with the wheezing and SOB  4.  Codeine cough syrup also prescribed.  5.  Rest, fluids.   6.  Isolation per protocol and standard COVID-19 guidelines.  7.  Encouraged her to have her husband get seen via telehealth visit with his primary care provider to discuss getting on Paxlovid.  8.  F/u with primary care provider for recheck.      Franchot Gallo, M.D.  Clinical assistant professor  Department of Family Medicine  [place] [facility]

## 2021-07-16 NOTE — Addendum Note (Signed)
Addended by: Ned Card MINSU on: 07/16/2021 10:27 AM     Modules accepted: Orders

## 2021-07-26 ENCOUNTER — Emergency Department (HOSPITAL_COMMUNITY): Payer: Medicare PPO

## 2021-07-26 ENCOUNTER — Other Ambulatory Visit: Payer: Self-pay

## 2021-07-26 ENCOUNTER — Encounter (HOSPITAL_COMMUNITY): Payer: Self-pay

## 2021-07-26 ENCOUNTER — Emergency Department (HOSPITAL_COMMUNITY)
Admission: EM | Admit: 2021-07-26 | Discharge: 2021-07-26 | Discharge status: Against Medical Advice | Payer: Medicare PPO | Attending: Physician Assistant | Admitting: Physician Assistant

## 2021-07-26 DIAGNOSIS — R0602 Shortness of breath: Secondary | ICD-10-CM | POA: Diagnosis not present

## 2021-07-26 DIAGNOSIS — R0781 Pleurodynia: Secondary | ICD-10-CM | POA: Insufficient documentation

## 2021-07-26 DIAGNOSIS — Z5321 Procedure and treatment not carried out due to patient leaving prior to being seen by health care provider: Secondary | ICD-10-CM | POA: Diagnosis not present

## 2021-07-26 DIAGNOSIS — R0789 Other chest pain: Secondary | ICD-10-CM | POA: Diagnosis not present

## 2021-07-26 LAB — CBC WITH DIFFERENTIAL/PLATELET
Abs Immature Granulocytes: 0.07 10*3/uL (ref 0.00–0.07)
Basophils Absolute: 0.1 10*3/uL (ref 0.0–0.1)
Basophils Relative: 1 %
Eosinophils Absolute: 0.3 10*3/uL (ref 0.0–0.5)
Eosinophils Relative: 2 %
HCT: 41.8 % (ref 36.0–46.0)
Hemoglobin: 13.4 g/dL (ref 12.0–15.0)
Immature Granulocytes: 0 %
Lymphocytes Relative: 8 %
Lymphs Abs: 1.3 10*3/uL (ref 0.7–4.0)
MCH: 29.3 pg (ref 26.0–34.0)
MCHC: 32.1 g/dL (ref 30.0–36.0)
MCV: 91.3 fL (ref 80.0–100.0)
Monocytes Absolute: 1.1 10*3/uL — ABNORMAL HIGH (ref 0.1–1.0)
Monocytes Relative: 7 %
Neutro Abs: 13.1 10*3/uL — ABNORMAL HIGH (ref 1.7–7.7)
Neutrophils Relative %: 82 %
Platelets: 373 10*3/uL (ref 150–400)
RBC: 4.58 MIL/uL (ref 3.87–5.11)
RDW: 12.9 % (ref 11.5–15.5)
WBC: 15.9 10*3/uL — ABNORMAL HIGH (ref 4.0–10.5)
nRBC: 0 % (ref 0.0–0.2)

## 2021-07-26 LAB — D-DIMER, QUANTITATIVE: D-Dimer, Quant: 0.29 ug/mL-FEU (ref 0.00–0.50)

## 2021-07-26 LAB — COMPREHENSIVE METABOLIC PANEL
ALT: 17 U/L (ref 0–44)
AST: 23 U/L (ref 15–41)
Albumin: 4.2 g/dL (ref 3.5–5.0)
Alkaline Phosphatase: 74 U/L (ref 38–126)
Anion gap: 7 (ref 5–15)
BUN: 18 mg/dL (ref 8–23)
CO2: 28 mmol/L (ref 22–32)
Calcium: 9.3 mg/dL (ref 8.9–10.3)
Chloride: 106 mmol/L (ref 98–111)
Creatinine, Ser: 0.72 mg/dL (ref 0.44–1.00)
GFR, Estimated: >60 mL/min (ref >60)
Glucose, Bld: 109 mg/dL — ABNORMAL HIGH (ref 70–99)
Potassium: 3.8 mmol/L (ref 3.5–5.1)
Sodium: 141 mmol/L (ref 135–145)
Total Bilirubin: 0.5 mg/dL (ref 0.3–1.2)
Total Protein: 7.6 g/dL (ref 6.5–8.1)

## 2021-07-26 LAB — TROPONIN I (HIGH SENSITIVITY): Troponin I (High Sensitivity): 2 ng/L (ref <18)

## 2021-07-26 LAB — LIPASE, BLOOD: Lipase: 34 U/L (ref 11–51)

## 2021-07-26 NOTE — ED Provider Triage Note (Signed)
Emergency Medicine Provider Triage Evaluation Note  Ellen Gray , a 76 y.o. female  was evaluated in triage.  Pt complains of right-sided rib, upper abdominal pain.  Began earlier today.  Worse with deep breathing.  Wraps around into her upper back.  No prior abdominal surgeries.  Has never had anything like this previously.  Did recently do some yard work.  No rashes or lesions.  No history of PE or DVT.  No recent surgery, immobilization or malignancy.  Review of Systems  Positive: Right chest pain Negative:   Physical Exam  BP 136/83   Pulse 86   Temp 99.1 F (37.3 C) (Oral)   Resp 19   Ht 5\' 4"  (1.626 m)   Wt 54.4 kg   SpO2 95%   BMI 20.60 kg/m  Gen:   Awake, no distress   Resp:  Normal effort MSK:   Moves extremities without difficulty  Other:    Medical Decision Making  Medically screening exam initiated at 7:32 PM.  Appropriate orders placed.  was informed that the remainder of the evaluation will be completed by another provider, this initial triage assessment does not replace that evaluation, and the importance of remaining in the ED until their evaluation is complete.  Right chest pain   Claira Jeter A, PA-C 07/26/21 1933

## 2021-07-26 NOTE — ED Notes (Signed)
Patient transported to X-ray 

## 2021-07-26 NOTE — ED Triage Notes (Signed)
Pt reports with right rib/chest pain that happened when she tried to lie down this evening.  ?

## 2021-07-26 NOTE — ED Notes (Signed)
Pt request to leave due to wait time. State result are in mychart and will have PCP take a look. IV remove from right [facility] ?

## 2021-07-27 DIAGNOSIS — J189 Pneumonia, unspecified organism: Secondary | ICD-10-CM | POA: Diagnosis not present

## 2021-08-14 ENCOUNTER — Other Ambulatory Visit: Payer: Self-pay | Admitting: Physician Assistant

## 2021-08-14 ENCOUNTER — Ambulatory Visit
Admission: RE | Admit: 2021-08-14 | Discharge: 2021-08-14 | Disposition: A | Discharge status: Home / Self Care | Payer: Medicare PPO | Source: Ambulatory Visit | Attending: Physician Assistant | Admitting: Physician Assistant

## 2021-08-14 DIAGNOSIS — R918 Other nonspecific abnormal finding of lung field: Secondary | ICD-10-CM | POA: Diagnosis not present

## 2021-08-14 DIAGNOSIS — J189 Pneumonia, unspecified organism: Secondary | ICD-10-CM

## 2021-08-14 DIAGNOSIS — D72829 Elevated white blood cell count, unspecified: Secondary | ICD-10-CM | POA: Diagnosis not present

## 2021-08-15 DIAGNOSIS — L820 Inflamed seborrheic keratosis: Secondary | ICD-10-CM | POA: Diagnosis not present

## 2021-08-15 DIAGNOSIS — L570 Actinic keratosis: Secondary | ICD-10-CM | POA: Diagnosis not present

## 2021-08-15 DIAGNOSIS — Z85828 Personal history of other malignant neoplasm of skin: Secondary | ICD-10-CM | POA: Diagnosis not present

## 2021-08-15 DIAGNOSIS — Z08 Encounter for follow-up examination after completed treatment for malignant neoplasm: Secondary | ICD-10-CM | POA: Diagnosis not present

## 2021-08-15 DIAGNOSIS — L821 Other seborrheic keratosis: Secondary | ICD-10-CM | POA: Diagnosis not present

## 2021-08-15 DIAGNOSIS — L814 Other melanin hyperpigmentation: Secondary | ICD-10-CM | POA: Diagnosis not present

## 2021-08-15 DIAGNOSIS — D225 Melanocytic nevi of trunk: Secondary | ICD-10-CM | POA: Diagnosis not present

## 2021-09-06 ENCOUNTER — Other Ambulatory Visit: Payer: Self-pay | Admitting: Internal Medicine

## 2021-09-06 DIAGNOSIS — E785 Hyperlipidemia, unspecified: Secondary | ICD-10-CM | POA: Diagnosis not present

## 2021-09-06 DIAGNOSIS — M810 Age-related osteoporosis without current pathological fracture: Secondary | ICD-10-CM | POA: Diagnosis not present

## 2021-09-06 DIAGNOSIS — Z23 Encounter for immunization: Secondary | ICD-10-CM | POA: Diagnosis not present

## 2021-09-06 DIAGNOSIS — Z0000 Encounter for general adult medical examination without abnormal findings: Secondary | ICD-10-CM | POA: Diagnosis not present

## 2021-09-06 DIAGNOSIS — Z8701 Personal history of pneumonia (recurrent): Secondary | ICD-10-CM | POA: Diagnosis not present

## 2021-09-06 DIAGNOSIS — E559 Vitamin D deficiency, unspecified: Secondary | ICD-10-CM | POA: Diagnosis not present

## 2021-09-06 DIAGNOSIS — J309 Allergic rhinitis, unspecified: Secondary | ICD-10-CM | POA: Diagnosis not present

## 2021-09-06 DIAGNOSIS — Z1231 Encounter for screening mammogram for malignant neoplasm of breast: Secondary | ICD-10-CM | POA: Diagnosis not present

## 2021-09-06 DIAGNOSIS — R498 Other voice and resonance disorders: Secondary | ICD-10-CM | POA: Diagnosis not present

## 2021-09-29 DIAGNOSIS — M810 Age-related osteoporosis without current pathological fracture: Secondary | ICD-10-CM | POA: Diagnosis not present

## 2021-09-29 DIAGNOSIS — J029 Acute pharyngitis, unspecified: Secondary | ICD-10-CM | POA: Diagnosis not present

## 2021-10-30 ENCOUNTER — Other Ambulatory Visit: Payer: Self-pay | Admitting: Internal Medicine

## 2021-10-30 DIAGNOSIS — Z1231 Encounter for screening mammogram for malignant neoplasm of breast: Secondary | ICD-10-CM

## 2021-11-03 ENCOUNTER — Ambulatory Visit
Admission: RE | Admit: 2021-11-03 | Discharge: 2021-11-03 | Disposition: A | Discharge status: Home / Self Care | Payer: Medicare PPO | Source: Ambulatory Visit | Attending: Internal Medicine | Admitting: Internal Medicine

## 2021-11-03 DIAGNOSIS — Z1231 Encounter for screening mammogram for malignant neoplasm of breast: Secondary | ICD-10-CM | POA: Diagnosis not present

## 2021-12-08 DIAGNOSIS — E785 Hyperlipidemia, unspecified: Secondary | ICD-10-CM | POA: Diagnosis not present

## 2022-03-05 IMAGING — MG DIGITAL SCREENING BILAT W/ TOMO W/ CAD
8 series · 9 of 28 positions shown · non-contrast
Comparison: Previous exam(s).

CLINICAL DATA: Screening.

EXAM:
DIGITAL SCREENING BILATERAL MAMMOGRAM WITH TOMO AND CAD

[R MLO synth-2D]
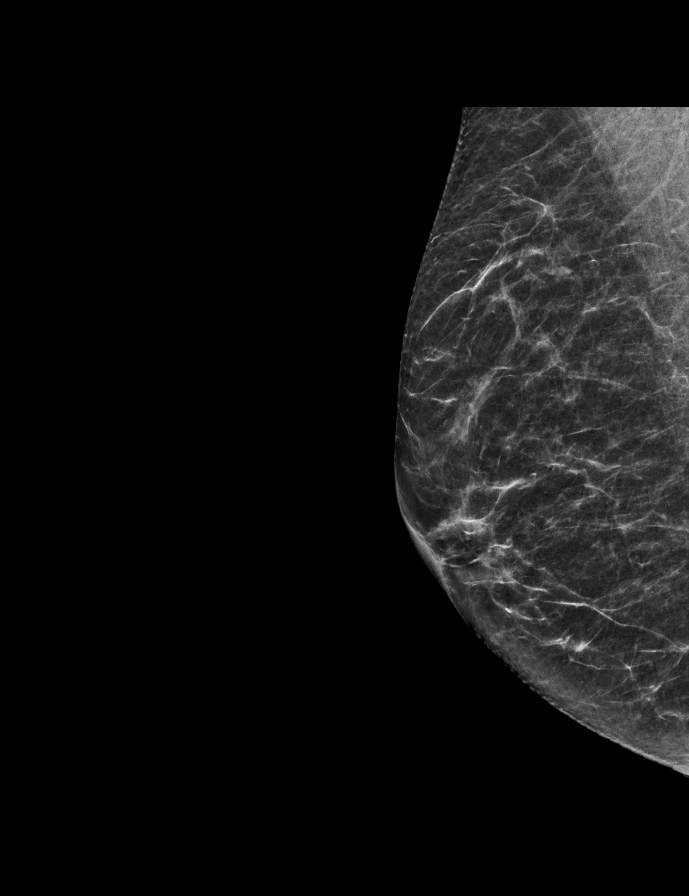

[R CC synth-2D]
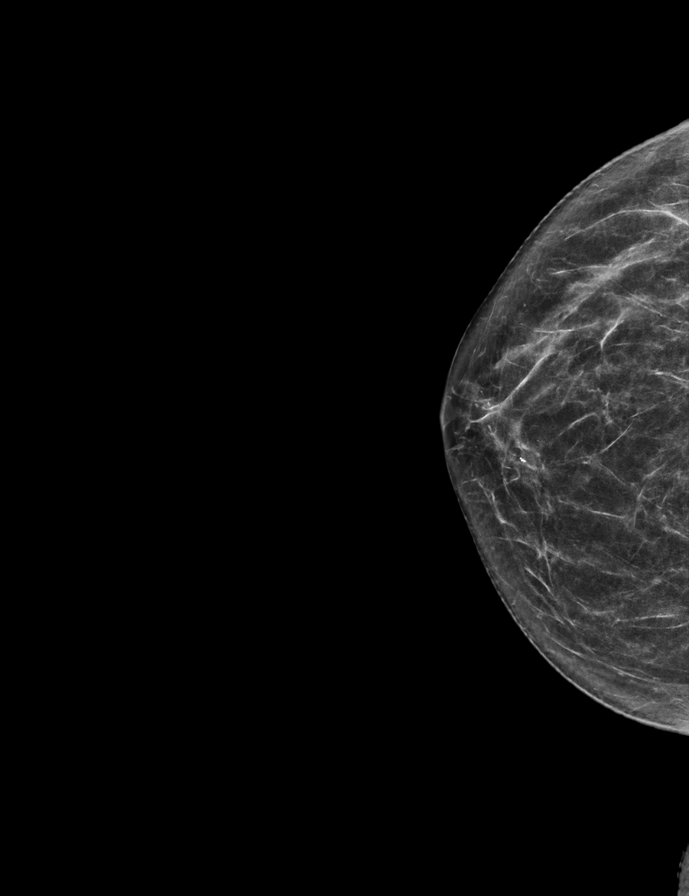

[R XCCL synth-2D]
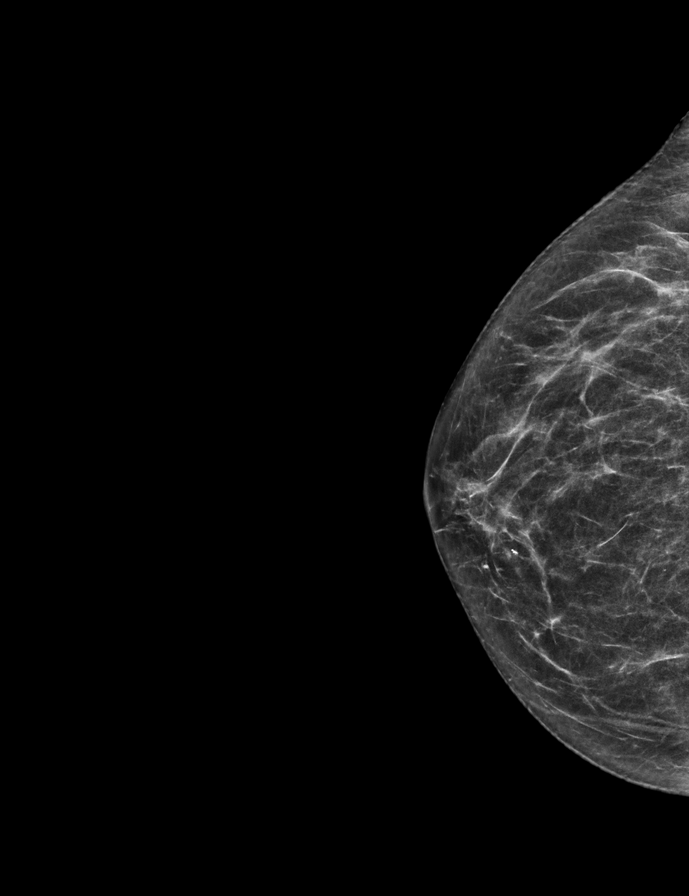

[R MLO tomo · 2 of 49 frames shown]
[frame 16/49]
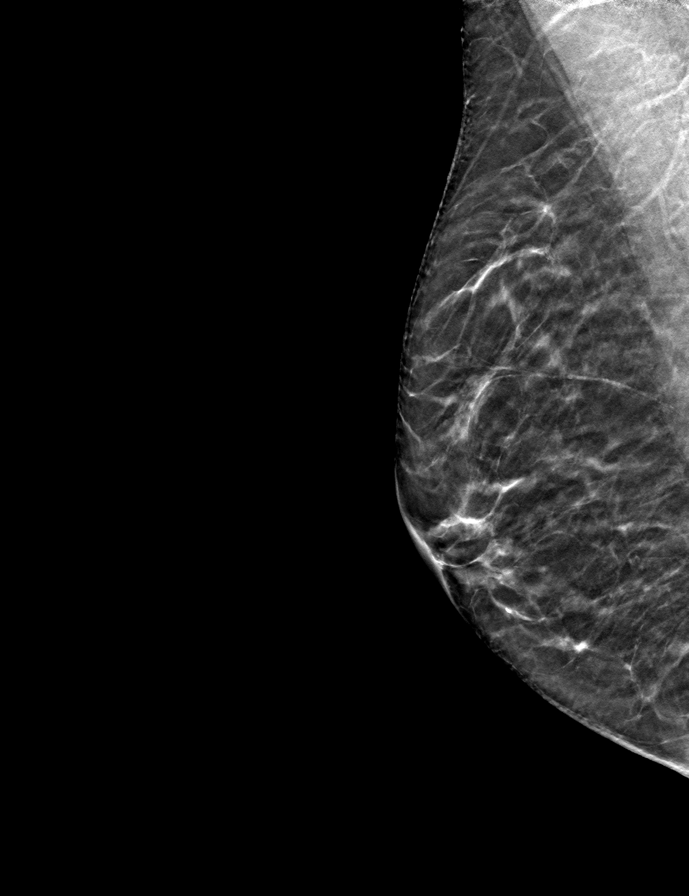
[frame 25/49]
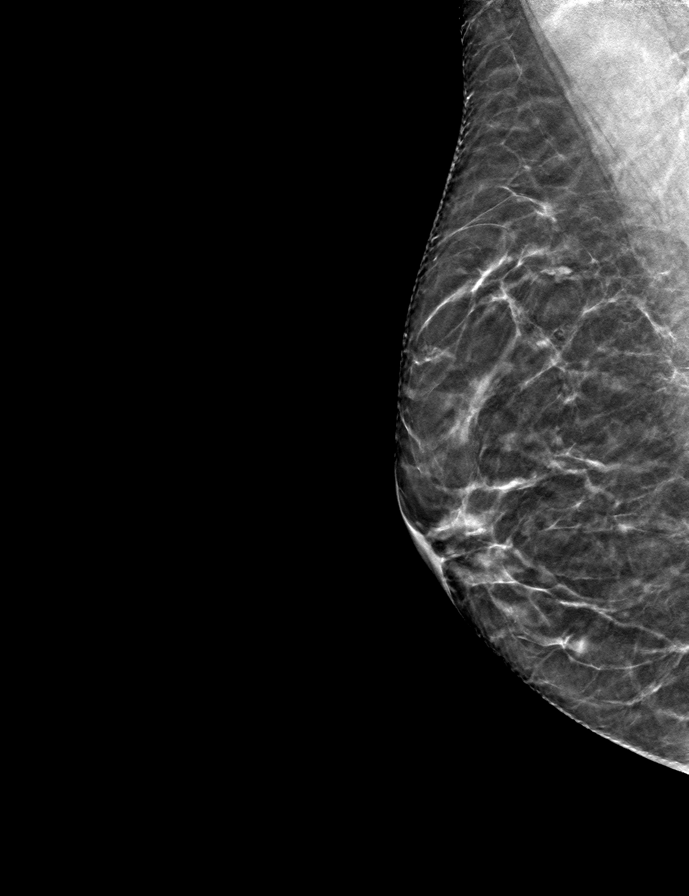

[L MLO tomo · tomo slice 28/55.0]
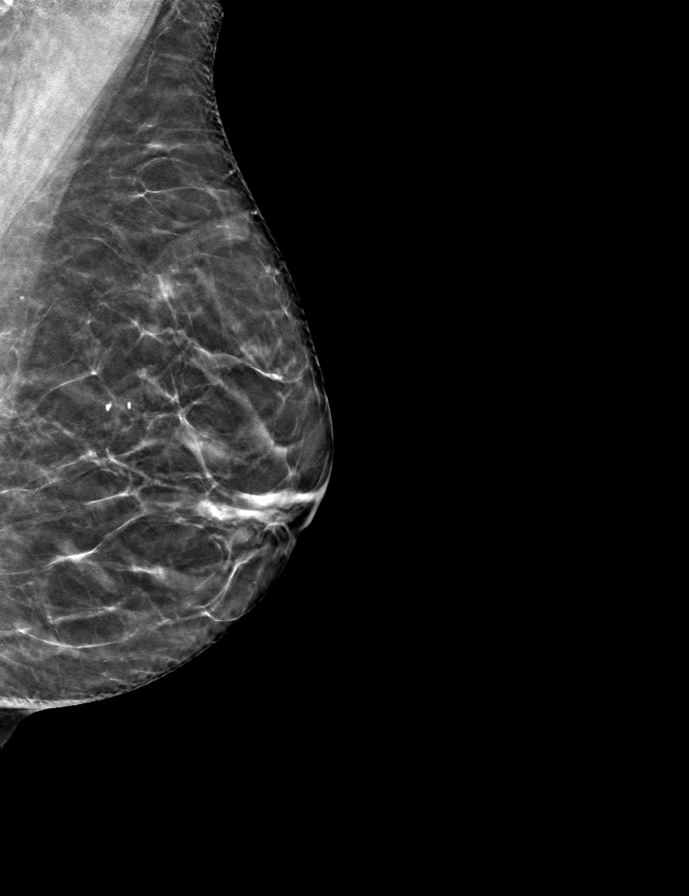

[R XCCL tomo · tomo slice 28/55.0]
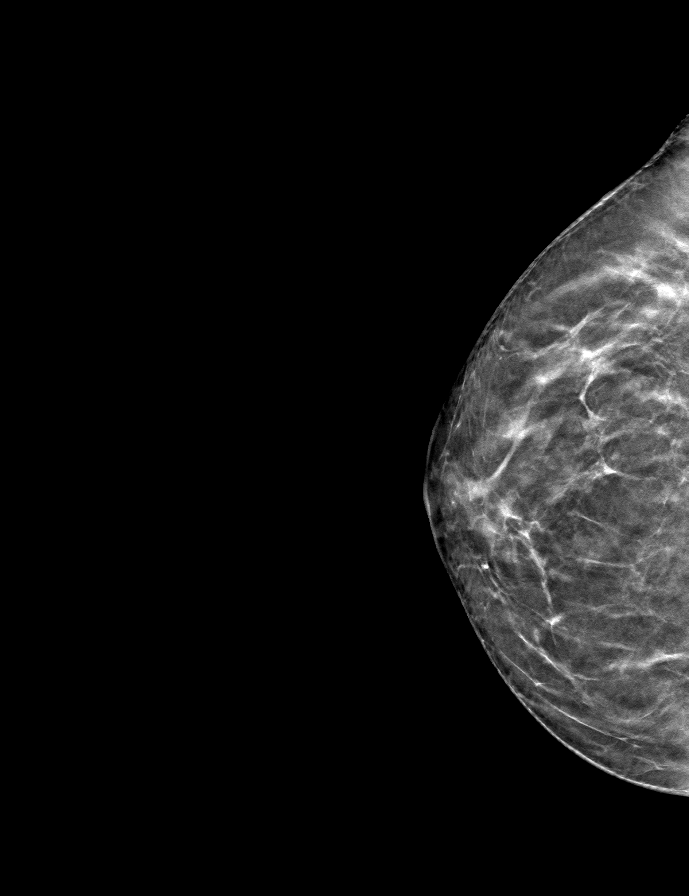

[R CC tomo · tomo slice 30/59.0]
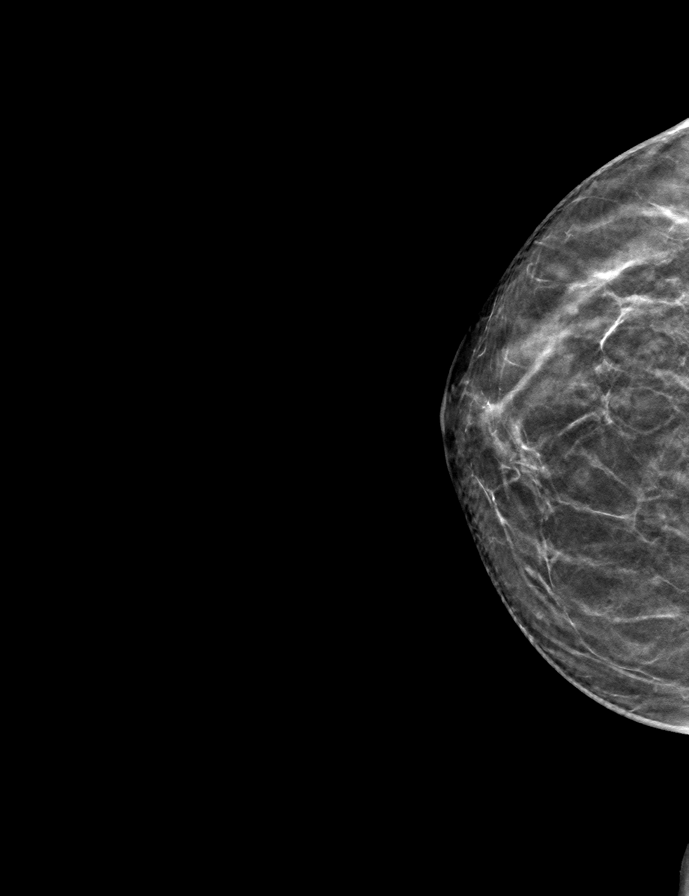

[L CC tomo · tomo slice 29/57.0]
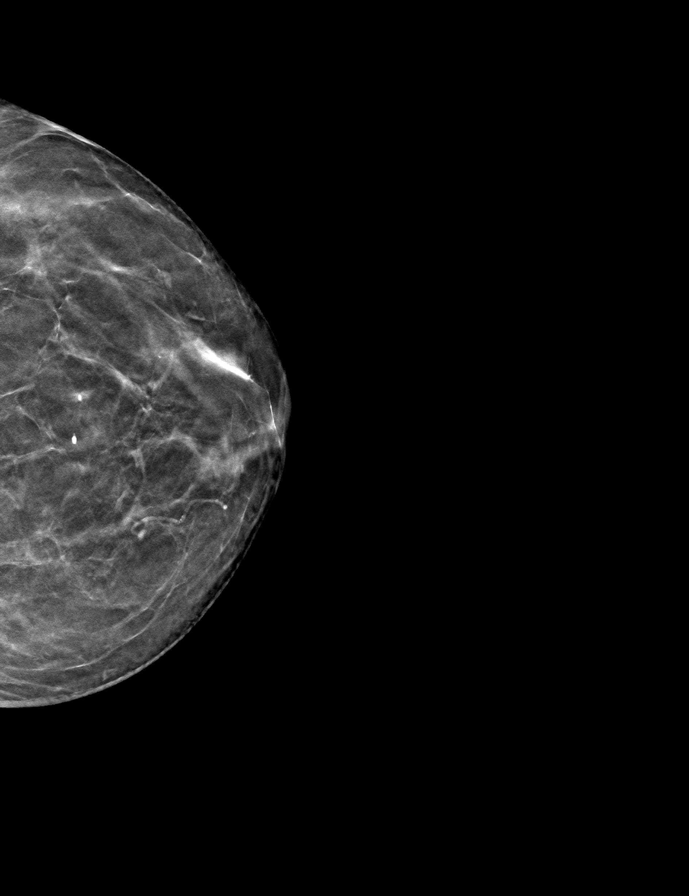

[9 of 28 positions shown; findings below may reference images not displayed]

ACR Breast Density Category b: There are scattered areas of
fibroglandular density.
FINDINGS: There are no findings suspicious for malignancy. Images were
processed with CAD.
IMPRESSION: No mammographic evidence of malignancy. A result letter of this
screening mammogram will be mailed directly to the patient.

RECOMMENDATION:
Screening mammogram in one year. (Code:[id number])

BI-RADS CATEGORY  1: Negative.

## 2022-03-19 ENCOUNTER — Other Ambulatory Visit: Payer: Medicare PPO

## 2022-04-18 ENCOUNTER — Ambulatory Visit
Admission: RE | Admit: 2022-04-18 | Discharge: 2022-04-18 | Disposition: A | Discharge status: Home / Self Care | Payer: Medicare PPO | Source: Ambulatory Visit | Attending: Internal Medicine | Admitting: Internal Medicine

## 2022-04-18 DIAGNOSIS — M810 Age-related osteoporosis without current pathological fracture: Secondary | ICD-10-CM | POA: Diagnosis not present

## 2022-04-18 DIAGNOSIS — Z780 Asymptomatic menopausal state: Secondary | ICD-10-CM | POA: Diagnosis not present

## 2022-04-18 DIAGNOSIS — M8588 Other specified disorders of bone density and structure, other site: Secondary | ICD-10-CM | POA: Diagnosis not present

## 2022-08-10 DIAGNOSIS — L570 Actinic keratosis: Secondary | ICD-10-CM | POA: Diagnosis not present

## 2022-08-10 DIAGNOSIS — L820 Inflamed seborrheic keratosis: Secondary | ICD-10-CM | POA: Diagnosis not present

## 2022-08-10 DIAGNOSIS — D225 Melanocytic nevi of trunk: Secondary | ICD-10-CM | POA: Diagnosis not present

## 2022-08-10 DIAGNOSIS — L821 Other seborrheic keratosis: Secondary | ICD-10-CM | POA: Diagnosis not present

## 2022-08-10 DIAGNOSIS — L814 Other melanin hyperpigmentation: Secondary | ICD-10-CM | POA: Diagnosis not present

## 2022-09-11 DIAGNOSIS — L820 Inflamed seborrheic keratosis: Secondary | ICD-10-CM | POA: Diagnosis not present

## 2022-09-11 DIAGNOSIS — L538 Other specified erythematous conditions: Secondary | ICD-10-CM | POA: Diagnosis not present

## 2022-09-17 DIAGNOSIS — M810 Age-related osteoporosis without current pathological fracture: Secondary | ICD-10-CM | POA: Diagnosis not present

## 2022-09-17 DIAGNOSIS — Z0000 Encounter for general adult medical examination without abnormal findings: Secondary | ICD-10-CM | POA: Diagnosis not present

## 2022-09-17 DIAGNOSIS — Z1331 Encounter for screening for depression: Secondary | ICD-10-CM | POA: Diagnosis not present

## 2022-09-17 DIAGNOSIS — E559 Vitamin D deficiency, unspecified: Secondary | ICD-10-CM | POA: Diagnosis not present

## 2022-09-17 DIAGNOSIS — Z85828 Personal history of other malignant neoplasm of skin: Secondary | ICD-10-CM | POA: Diagnosis not present

## 2022-09-17 DIAGNOSIS — M1990 Unspecified osteoarthritis, unspecified site: Secondary | ICD-10-CM | POA: Diagnosis not present

## 2022-09-17 DIAGNOSIS — E785 Hyperlipidemia, unspecified: Secondary | ICD-10-CM | POA: Diagnosis not present

## 2022-11-15 DIAGNOSIS — N952 Postmenopausal atrophic vaginitis: Secondary | ICD-10-CM | POA: Diagnosis not present

## 2022-11-15 DIAGNOSIS — R300 Dysuria: Secondary | ICD-10-CM | POA: Diagnosis not present

## 2022-11-28 DIAGNOSIS — N390 Urinary tract infection, site not specified: Secondary | ICD-10-CM | POA: Diagnosis not present

## 2022-12-05 DIAGNOSIS — N390 Urinary tract infection, site not specified: Secondary | ICD-10-CM | POA: Diagnosis not present

## 2022-12-07 DIAGNOSIS — H2513 Age-related nuclear cataract, bilateral: Secondary | ICD-10-CM | POA: Diagnosis not present

## 2023-02-01 DIAGNOSIS — Z23 Encounter for immunization: Secondary | ICD-10-CM | POA: Diagnosis not present

## 2023-02-15 DIAGNOSIS — N898 Other specified noninflammatory disorders of vagina: Secondary | ICD-10-CM | POA: Diagnosis not present

## 2023-02-15 DIAGNOSIS — R350 Frequency of micturition: Secondary | ICD-10-CM | POA: Diagnosis not present

## 2023-03-07 DIAGNOSIS — R32 Unspecified urinary incontinence: Secondary | ICD-10-CM | POA: Diagnosis not present

## 2023-03-07 DIAGNOSIS — R300 Dysuria: Secondary | ICD-10-CM | POA: Diagnosis not present

## 2023-05-23 ENCOUNTER — Encounter (HOSPITAL_COMMUNITY): Payer: Self-pay | Admitting: Emergency Medicine

## 2023-05-23 ENCOUNTER — Emergency Department (HOSPITAL_COMMUNITY)

## 2023-05-23 ENCOUNTER — Emergency Department (HOSPITAL_COMMUNITY)
Admission: EM | Admit: 2023-05-23 | Discharge: 2023-05-23 | Disposition: A | Discharge status: Home / Self Care | Attending: Emergency Medicine | Admitting: Emergency Medicine

## 2023-05-23 ENCOUNTER — Other Ambulatory Visit: Payer: Self-pay

## 2023-05-23 DIAGNOSIS — R0789 Other chest pain: Secondary | ICD-10-CM | POA: Insufficient documentation

## 2023-05-23 DIAGNOSIS — R0602 Shortness of breath: Secondary | ICD-10-CM | POA: Diagnosis not present

## 2023-05-23 DIAGNOSIS — R079 Chest pain, unspecified: Secondary | ICD-10-CM | POA: Diagnosis not present

## 2023-05-23 LAB — CBC
HCT: 39 % (ref 36.0–46.0)
Hemoglobin: 12.8 g/dL (ref 12.0–15.0)
MCH: 30.4 pg (ref 26.0–34.0)
MCHC: 32.8 g/dL (ref 30.0–36.0)
MCV: 92.6 fL (ref 80.0–100.0)
Platelets: 206 10*3/uL (ref 150–400)
RBC: 4.21 MIL/uL (ref 3.87–5.11)
RDW: 13.2 % (ref 11.5–15.5)
WBC: 6.2 10*3/uL (ref 4.0–10.5)
nRBC: 0 % (ref 0.0–0.2)

## 2023-05-23 LAB — COMPREHENSIVE METABOLIC PANEL
ALT: 21 U/L (ref 0–44)
AST: 30 U/L (ref 15–41)
Albumin: 3.9 g/dL (ref 3.5–5.0)
Alkaline Phosphatase: 45 U/L (ref 38–126)
Anion gap: 6 (ref 5–15)
BUN: 23 mg/dL (ref 8–23)
CO2: 23 mmol/L (ref 22–32)
Calcium: 8.7 mg/dL — ABNORMAL LOW (ref 8.9–10.3)
Chloride: 109 mmol/L (ref 98–111)
Creatinine, Ser: 0.8 mg/dL (ref 0.44–1.00)
GFR, Estimated: >60 mL/min (ref >60)
Glucose, Bld: 105 mg/dL — ABNORMAL HIGH (ref 70–99)
Potassium: 4 mmol/L (ref 3.5–5.1)
Sodium: 138 mmol/L (ref 135–145)
Total Bilirubin: 0.6 mg/dL (ref 0.0–1.2)
Total Protein: 6.2 g/dL — ABNORMAL LOW (ref 6.5–8.1)

## 2023-05-23 LAB — TROPONIN I (HIGH SENSITIVITY)
Troponin I (High Sensitivity): 2 ng/L (ref <18)
Troponin I (High Sensitivity): 2 ng/L (ref <18)

## 2023-05-23 LAB — D-DIMER, QUANTITATIVE: D-Dimer, Quant: <0.27 ug{FEU}/mL (ref 0.00–0.50)

## 2023-05-23 MED ORDER — ASPIRIN 81 MG PO CHEW
324.0000 mg | CHEWABLE_TABLET | Freq: Once | ORAL | Status: AC
  Administered 2023-05-23: 324 mg via ORAL
  Filled 2023-05-23: qty 4

## 2023-05-23 MED ORDER — ALUM & MAG HYDROXIDE-SIMETH 200-200-20 MG/5ML PO SUSP
30.0000 mL | Freq: Once | ORAL | Status: AC
  Administered 2023-05-23: 30 mL via ORAL
  Filled 2023-05-23: qty 30

## 2023-05-23 MED ORDER — LIDOCAINE-EPINEPHRINE (PF) 2 %-1:200000 IJ SOLN
20.0000 mL | Freq: Once | INTRAMUSCULAR | Status: DC
  Filled 2023-05-23: qty 20

## 2023-05-23 MED ORDER — LIDOCAINE VISCOUS HCL 2 % MT SOLN
15.0000 mL | Freq: Once | OROMUCOSAL | Status: AC
  Administered 2023-05-23: 15 mL via OROMUCOSAL
  Filled 2023-05-23: qty 15

## 2023-05-23 NOTE — ED Notes (Signed)
 Patient transported to X-ray

## 2023-05-23 NOTE — ED Notes (Signed)
 Reviewed D/C information with the patient, pt verbalized understanding. No additional concerns at this time.

## 2023-05-23 NOTE — ED Triage Notes (Signed)
 Patient c/o chest pain that started when she woke up and went to the restroom this AM.  Patient also reports right arm pain.  Denies n/v/sob.

## 2023-05-23 NOTE — ED Provider Notes (Signed)
 [place] EMERGENCY DEPARTMENT AT [facility] Provider Note   CSN: [id number] Arrival date & time: 05/23/23  0221     History  Chief Complaint  Patient presents with   Chest Pain    Ellen Gray is a 78 y.o. female.  Patient from home with episode of chest pain and pressure that woke her from sleep about 2 AM.  States she went to bed feeling well and woke up with pressure across her entire chest that radiated to her right arm.  Mild shortness of breath.  No nausea, vomiting, cough, fever, chills, diaphoresis.  Pain has since resolved on its own after about 1 hour.  She is never had this kind of pain in the past.  Denies any cardiac history.  States she has no medical problems and takes no medications.  She has never had a stress test or cardiac catheterization.  No history of ulcers or acid reflux.  Feels back to baseline now.  Pain was in her entire chest and felt like a pressure that radiated to her right arm down to the elbow.  No numbness or tingling.  No diaphoresis, nausea or vomiting.  No fever or cough.  No leg pain or leg swelling.  The history is provided by the patient.  Chest Pain Associated symptoms: shortness of breath   Associated symptoms: no abdominal pain, no dizziness, no fever, no headache, no nausea, no vomiting and no weakness        Home Medications Prior to Admission medications   Medication Sig Start Date End Date Taking? Authorizing Provider  Calcium Carbonate-Vit D-Min (CALCIUM 1200 PO) Take by mouth.    [provider]  cetirizine (ZYRTEC) 10 MG tablet Take 1 tablet (10 mg total) by mouth daily for 7 days. 01/16/18 01/23/18  Georgina Quint, MD  diphenhydramine-acetaminophen (TYLENOL PM) 25-500 MG TABS Take 1 tablet by mouth at bedtime.    [provider]  Multiple Vitamin (MULTIVITAMIN WITH MINERALS) TABS tablet Take 1 tablet by mouth daily.    [provider]  triamcinolone cream (KENALOG) 0.1 % Apply  1 application topically 2 (two) times daily. Patient not taking: Reported on 01/16/2018 04/19/17   McVey, Madelaine Bhat, PA-C  Vitamin D, Ergocalciferol, (DRISDOL) 50000 UNITS CAPS capsule Take 50,000 Units by mouth every 7 (seven) days.    [provider]      Allergies    Patient has no known allergies.    Review of Systems   Review of Systems  Constitutional:  Negative for activity change, appetite change and fever.  HENT:  Negative for congestion and rhinorrhea.   Respiratory:  Positive for chest tightness and shortness of breath.   Cardiovascular:  Positive for chest pain.  Gastrointestinal:  Negative for abdominal pain, nausea and vomiting.  Genitourinary:  Negative for dysuria and hematuria.  Musculoskeletal:  Negative for arthralgias and myalgias.  Neurological:  Negative for dizziness, weakness and headaches.   all other systems are negative except as noted in the HPI and PMH.    Physical Exam Updated Vital Signs BP (!) 174/96   Pulse 68   Temp 98.6 F (37 C) (Oral)   Resp 20   Wt 55 kg   SpO2 99%   BMI 20.81 kg/m  Physical Exam Vitals and nursing note reviewed.  Constitutional:      General: She is not in acute distress.    Appearance: She is well-developed.  HENT:     Head: Normocephalic and atraumatic.  Mouth/Throat:     Pharynx: No oropharyngeal exudate.  Eyes:     Conjunctiva/sclera: Conjunctivae normal.     Pupils: Pupils are equal, round, and reactive to light.  Neck:     Comments: No meningismus. Cardiovascular:     Rate and Rhythm: Normal rate and regular rhythm.     Heart sounds: Normal heart sounds. No murmur heard. Pulmonary:     Effort: Pulmonary effort is normal. No respiratory distress.     Breath sounds: Normal breath sounds.  Abdominal:     Palpations: Abdomen is soft.     Tenderness: There is no abdominal tenderness. There is no guarding or rebound.  Musculoskeletal:        General: No tenderness. Normal range of motion.      Cervical back: Normal range of motion and neck supple.  Skin:    General: Skin is warm.  Neurological:     Mental Status: She is alert and oriented to person, place, and time.     Cranial Nerves: No cranial nerve deficit.     Motor: No abnormal muscle tone.     Coordination: Coordination normal.     Comments:  5/5 strength throughout. CN 2-12 intact.Equal grip strength.   Psychiatric:        Behavior: Behavior normal.     ED Results / Procedures / Treatments   Labs (all labs ordered are listed, but only abnormal results are displayed) Labs Reviewed  COMPREHENSIVE METABOLIC PANEL - Abnormal; Notable for the following components:      Result Value   Glucose, Bld 105 (*)    Calcium 8.7 (*)    Total Protein 6.2 (*)    All other components within normal limits  CBC  D-DIMER, QUANTITATIVE  TROPONIN I (HIGH SENSITIVITY)  TROPONIN I (HIGH SENSITIVITY)    EKG EKG Interpretation Date/Time:  Thursday May 23 2023 02:27:40 EDT Ventricular Rate:  61 PR Interval:  168 QRS Duration:  86 QT Interval:  428 QTC Calculation: 432 R Axis:   87  Text Interpretation: Sinus rhythm Consider left atrial enlargement Borderline right axis deviation No significant change was found Confirmed by Glynn Octave [phone]) on 05/23/2023 2:45:18 AM  Radiology DG Chest 2 View Result Date: 05/23/2023 CLINICAL DATA:  chest pain EXAM: CHEST - 2 VIEW COMPARISON:  Chest x-ray 08/14/2021 FINDINGS: The heart and mediastinal contours are within normal limits. No focal consolidation. No pulmonary edema. No pleural effusion. No pneumothorax. No acute osseous abnormality. IMPRESSION: No active cardiopulmonary disease. Electronically Signed   By: Tish Frederickson M.D.   On: 05/23/2023 03:42    Procedures Procedures    Medications Ordered in ED Medications  alum & mag hydroxide-simeth (MAALOX/MYLANTA) 200-200-20 MG/5ML suspension 30 mL (has no administration in time range)  lidocaine-EPINEPHrine (XYLOCAINE  W/EPI) 2 %-1:200000 (PF) injection 20 mL (has no administration in time range)    ED Course/ Medical Decision Making/ A&P                                 Medical Decision Making Amount and/or Complexity of Data Reviewed Labs: ordered. Decision-making details documented in ED Course. Radiology: ordered and independent interpretation performed. Decision-making details documented in ED Course. ECG/medicine tests: ordered and independent interpretation performed. Decision-making details documented in ED Course.  Risk OTC drugs. Prescription drug management.   Episode of chest pain and pressure rating to the right arm, now resolved.  Symptoms lasted 1 hour.  Denies cardiac history.  EKG is sinus rhythm without acute ST changes.  Will give empiric aspirin check labs, chest x-ray  Patient given ASA and GI cocktail.  Troponin negative.  D-dimer negative.  Chest x-ray negative for pneumothorax or pneumonia.  Results reviewed interpreted by me.  Observed in the ED with no recurrence of her chest pain.  Pain has resolved.  Troponin is negative x 2.  Discussed she is low risk for ACS but nonzero risk.  Recommend follow-up for an outpatient stress test. Heart score is 2 based on age.  Low suspicion for aortic dissection, pulmonary embolism.  Discussed follow-up with PCP as well as cardiology for stress test.  Return to the ED with exertional chest pain, pain associate shortness of breath, nausea, vomiting, sweating or other concerns.       Final Clinical Impression(s) / ED Diagnoses Final diagnoses:  None    Rx / DC Orders ED Discharge Orders          Ordered    Ambulatory referral to Cardiology        05/23/23 0355              Glynn Octave, MD 05/23/23 [phone]

## 2023-05-23 NOTE — Discharge Instructions (Signed)
 Testing is reassuring.  No evidence of heart attack.  This however does not mean that you do not have heart disease.  You should follow-up with a cardiologist to have a stress test.  Return to the ED with exertional chest pain, pain associate with shortness of breath, nausea, vomiting, sweating or other concerns.

## 2023-08-23 ENCOUNTER — Other Ambulatory Visit: Payer: Self-pay | Admitting: Internal Medicine

## 2023-08-23 DIAGNOSIS — Z1231 Encounter for screening mammogram for malignant neoplasm of breast: Secondary | ICD-10-CM

## 2023-08-26 ENCOUNTER — Ambulatory Visit

## 2023-09-11 ENCOUNTER — Ambulatory Visit

## 2023-09-11 DIAGNOSIS — L814 Other melanin hyperpigmentation: Secondary | ICD-10-CM | POA: Diagnosis not present

## 2023-09-11 DIAGNOSIS — L2989 Other pruritus: Secondary | ICD-10-CM | POA: Diagnosis not present

## 2023-09-11 DIAGNOSIS — L820 Inflamed seborrheic keratosis: Secondary | ICD-10-CM | POA: Diagnosis not present

## 2023-09-11 DIAGNOSIS — L538 Other specified erythematous conditions: Secondary | ICD-10-CM | POA: Diagnosis not present

## 2023-09-11 DIAGNOSIS — D225 Melanocytic nevi of trunk: Secondary | ICD-10-CM | POA: Diagnosis not present

## 2023-09-11 DIAGNOSIS — L821 Other seborrheic keratosis: Secondary | ICD-10-CM | POA: Diagnosis not present

## 2023-10-31 DIAGNOSIS — R0789 Other chest pain: Secondary | ICD-10-CM | POA: Diagnosis not present

## 2023-10-31 DIAGNOSIS — Z0000 Encounter for general adult medical examination without abnormal findings: Secondary | ICD-10-CM | POA: Diagnosis not present

## 2023-10-31 DIAGNOSIS — C801 Malignant (primary) neoplasm, unspecified: Secondary | ICD-10-CM | POA: Diagnosis not present

## 2023-10-31 DIAGNOSIS — J309 Allergic rhinitis, unspecified: Secondary | ICD-10-CM | POA: Diagnosis not present

## 2023-10-31 DIAGNOSIS — Z8249 Family history of ischemic heart disease and other diseases of the circulatory system: Secondary | ICD-10-CM | POA: Diagnosis not present

## 2023-10-31 DIAGNOSIS — M810 Age-related osteoporosis without current pathological fracture: Secondary | ICD-10-CM | POA: Diagnosis not present

## 2023-10-31 DIAGNOSIS — Z1331 Encounter for screening for depression: Secondary | ICD-10-CM | POA: Diagnosis not present

## 2023-10-31 DIAGNOSIS — E785 Hyperlipidemia, unspecified: Secondary | ICD-10-CM | POA: Diagnosis not present

## 2023-11-18 ENCOUNTER — Ambulatory Visit: Admitting: Cardiology

## 2023-11-28 DIAGNOSIS — W548XXA Other contact with dog, initial encounter: Secondary | ICD-10-CM | POA: Diagnosis not present

## 2023-11-28 DIAGNOSIS — S41112A Laceration without foreign body of left upper arm, initial encounter: Secondary | ICD-10-CM | POA: Diagnosis not present

## 2023-12-16 ENCOUNTER — Ambulatory Visit: Admitting: Cardiology

## 2024-01-02 DIAGNOSIS — H2513 Age-related nuclear cataract, bilateral: Secondary | ICD-10-CM | POA: Diagnosis not present

## 2024-01-02 DIAGNOSIS — H524 Presbyopia: Secondary | ICD-10-CM | POA: Diagnosis not present

## 2024-01-02 DIAGNOSIS — H43393 Other vitreous opacities, bilateral: Secondary | ICD-10-CM | POA: Diagnosis not present

## 2024-01-13 DIAGNOSIS — F411 Generalized anxiety disorder: Secondary | ICD-10-CM | POA: Diagnosis not present

## 2024-01-13 DIAGNOSIS — R413 Other amnesia: Secondary | ICD-10-CM | POA: Diagnosis not present

## 2024-01-21 ENCOUNTER — Ambulatory Visit

## 2024-02-24 ENCOUNTER — Other Ambulatory Visit (HOSPITAL_BASED_OUTPATIENT_CLINIC_OR_DEPARTMENT_OTHER): Payer: Self-pay | Admitting: Internal Medicine

## 2024-02-24 DIAGNOSIS — M810 Age-related osteoporosis without current pathological fracture: Secondary | ICD-10-CM

## 2024-07-23 ENCOUNTER — Other Ambulatory Visit (HOSPITAL_BASED_OUTPATIENT_CLINIC_OR_DEPARTMENT_OTHER)
